# Patient Record
Sex: Female | Born: 1995 | Race: White | Hispanic: No | Marital: Single | Smoking: Current every day smoker
Health system: Southern US, Community
[De-identification: ages and names within clinical notes are randomized; demographics above are authoritative.]

## PROBLEM LIST (undated history)

## (undated) DIAGNOSIS — F32A Depression, unspecified: Secondary | ICD-10-CM

## (undated) DIAGNOSIS — R630 Anorexia: Secondary | ICD-10-CM

## (undated) DIAGNOSIS — F329 Major depressive disorder, single episode, unspecified: Secondary | ICD-10-CM

## (undated) DIAGNOSIS — F419 Anxiety disorder, unspecified: Secondary | ICD-10-CM

## (undated) HISTORY — DX: Anorexia: R63.0

---
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
## 2017-03-14 ENCOUNTER — Ambulatory Visit: Payer: Managed Care, Other (non HMO) | Admitting: Certified Nurse Midwife

## 2017-03-14 ENCOUNTER — Encounter: Payer: Self-pay | Admitting: Certified Nurse Midwife

## 2017-03-14 VITALS — BP 110/67 | HR 91 | Wt 134.6 lb

## 2017-03-14 DIAGNOSIS — R102 Pelvic and perineal pain: Secondary | ICD-10-CM | POA: Diagnosis not present

## 2017-03-14 DIAGNOSIS — N941 Unspecified dyspareunia: Secondary | ICD-10-CM | POA: Diagnosis not present

## 2017-03-14 LAB — POCT URINE PREGNANCY: PREG TEST UR: NEGATIVE

## 2017-03-14 NOTE — Patient Instructions (Signed)
Pelvic Pain, Female °Pelvic pain is pain in your lower belly (abdomen), below your belly button and between your hips. The pain may start suddenly (acute), keep coming back (recurring), or last a long time (chronic). Pelvic pain that lasts longer than six months is considered chronic. There are many causes of pelvic pain. Sometimes the cause of your pelvic pain is not known. °Follow these instructions at home: °· Take over-the-counter and prescription medicines only as told by your doctor. °· Rest as told by your doctor. °· Do not have sex it if hurts. °· Keep a journal of your pelvic pain. Write down: °? When the pain started. °? Where the pain is located. °? What seems to make the pain better or worse, such as food or your menstrual cycle. °? Any symptoms you have along with the pain. °· Keep all follow-up visits as told by your doctor. This is important. °Contact a doctor if: °· Medicine does not help your pain. °· Your pain comes back. °· You have new symptoms. °· You have unusual vaginal discharge or bleeding. °· You have a fever or chills. °· You are having a hard time pooping (constipation). °· You have blood in your pee (urine) or poop (stool). °· Your pee smells bad. °· You feel weak or lightheaded. °Get help right away if: °· You have sudden pain that is very bad. °· Your pain continues to get worse. °· You have very bad pain and also have any of the following symptoms: °? A fever. °? Feeling stick to your stomach (nausea). °? Throwing up (vomiting). °? Being very sweaty. °· You pass out (lose consciousness). °This information is not intended to replace advice given to you by your health care provider. Make sure you discuss any questions you have with your health care provider. °Document Released: 07/14/2007 Document Revised: 02/19/2015 Document Reviewed: 11/15/2014 °Elsevier Interactive Patient Education © 2018 Elsevier Inc. ° °

## 2017-03-14 NOTE — Progress Notes (Signed)
GYN ENCOUNTER NOTE  Subjective:       Megan Reed is a 22 y.o. G61P0010 female is here for gynecologic evaluation of the following issues:  1. Pelvic pain and pinful intercourse since December.  She states  She had a mirena IUD in place that she accidentally pulled out because she thought it was her tampon. After her IUD came out she became pregnant and had an abortion in March 2018.  She then had a skyla IUD placed August in 2018. She has a history of chlamydia infection that was treated. She originally went to her campus clinic and was treated with "antibiotics for pelvic infection." .  She is currently sexually active with one female partner. She states that he has had STD testing and was cleared. She complains that the pain comes in waves , that it is more with penetration, she denies it being deep or with thrusting.    Gynecologic History Patient's last menstrual period was 02/28/2017. Monthly, lasting 3-4 days with spotting intermittently. She has cramps that she treats with motrin 600 mg q 6 hours.  Uses tampons /pads changes 2-3 times a day. Increase frequency  in lose stools with period Contraception: IUD Skyla Last Pap: n/a.  Last mammogram: N/a  Past Family GYN history: Pt mother hysterectomy for endometriosis  Pt sisters have painful periods, heavy bleeding  Obstetric History OB History  Gravida Para Term Preterm AB Living  2       1    SAB TAB Ectopic Multiple Live Births               # Outcome Date GA Lbr Len/2nd Weight Sex Delivery Anes PTL Lv  2 AB 2018          1 Gravida               History reviewed. No pertinent past medical history.  History reviewed. No pertinent surgical history.  Current Outpatient Medications on File Prior to Visit  Medication Sig Dispense Refill  . lisdexamfetamine (VYVANSE) 30 MG capsule Take 30 mg by mouth daily.    . sertraline (ZOLOFT) 25 MG tablet Take 25 mg by mouth daily.     No current facility-administered medications on file  prior to visit.     Not on File  Social History   Socioeconomic History  . Marital status: Single    Spouse name: Not on file  . Number of children: Not on file  . Years of education: Not on file  . Highest education level: Not on file  Social Needs  . Financial resource strain: Not on file  . Food insecurity - worry: Not on file  . Food insecurity - inability: Not on file  . Transportation needs - medical: Not on file  . Transportation needs - non-medical: Not on file  Occupational History  . Not on file  Tobacco Use  . Smoking status: Never Smoker  . Smokeless tobacco: Never Used  Substance and Sexual Activity  . Alcohol use: No    Frequency: Never  . Drug use: No  . Sexual activity: Yes    Birth control/protection: IUD  Other Topics Concern  . Not on file  Social History Narrative  . Not on file    History reviewed. No pertinent family history.  The following portions of the patient's history were reviewed and updated as appropriate: allergies, current medications, past family history, past medical history, past social history, past surgical history and problem list.  Review  of Systems Review of Systems - Negative except as mentioned in HPI Review of Systems - General ROS: negative for -  fatigue, fever, hot flashes, malaise or night sweats. Positive for chills Hematological and Lymphatic ROS: negative for - bleeding problems or swollen lymph nodes Gastrointestinal ROS: negative for - abdominal pain, blood in stools, and nausea/vomiting. Positive for change in bowel habits, increased and lose with period.  Musculoskeletal ROS: negative for - joint pain, muscle pain or muscular weakness Genito-Urinary ROS: negative for - change in menstrual cycle, dysmenorrhea,  dysuria, genital discharge, genital ulcers, hematuria, incontinence, irregular/heavy menses, nocturia. Positive for  pelvic pain and dyspareunia  Objective:   BP 110/67   Pulse 91   Wt 134 lb 9.6 oz (61.1  kg)   LMP 02/28/2017 Comment: irregular CONSTITUTIONAL: Well-developed, well-nourished female in no acute distress.  HENT:  Normocephalic, atraumatic.  NECK: Normal range of motion,  SKIN: Skin is warm and dry. No rash noted. Not diaphoretic. No erythema. No pallor. NEUROLGIC: Alert and oriented to person, place, and time.  PSYCHIATRIC: Normal mood and affect. Normal behavior. Normal judgment and thought content. CARDIOVASCULAR:Not Examined RESPIRATORY: Not Examined BREASTS: Not Examined ABDOMEN: Soft, non distended; Non tender. Tenderness over left ovary.  No Organomegaly. PELVIC: Per Dr. Greggory Keenefrancesco exam   External Genitalia: Normal, small scar on right side from abscess. Right labia noted to have dark brown discoloration that was biopsied and resulted normal. No lesions   BUS: Normal  Vagina: Normal, vaginismus with placement of speculum and bimanual exam, no lesions or odor   Cervix: Normal in appearance, strings present, 2/4 CVA tenderness   Uterus: Normal size, shape,consistency, mobile 2/4 tenderness , retroverted   Adnexa: Normal  RV: Normal   Bladder: Nontender MUSCULOSKELETAL: Normal range of motion. No tenderness.  No cyanosis, clubbing, or edema.   Reviewed Results from Clinic ([facility]) 02/28/17 from the clinic were negative for chlamydia, gonorrhea , and trichomoniasis. To be scanned into chart.   Assessment:   1. Missed menses - POCT urine pregnancy  2. Pelvic pain - Hepatitis B surface antigen - HIV antibody - HSV 1 AND 2 IGM ABS, INDIRECT - NuSwab Vaginitis Plus (VG+) - RPR - US PELVIS (TRANSABDOMINAL ONLY); Future   3. Vaginismus  Plan:   R/o STD's. Labs HIV, RPR, Hep B, HSV. Nuswab ,  and ultrasound for IUD placement. Discussed possibility of endometriosis , reviewed treatment options , and need for laparoscopy for diagnosis. Pamphlet given . Reviewed use of Mirena IUD, birth control pills, and motrin for pain for treatment of endometriosis .  Pt states that  she had the mirena the first time but switched due to acne. Discussed possible causes of vaginismus and psychological causes. She verbalizes understanding and agrees with plan. Will follow up with results of labs and ultrasound. Reviewed and summarization of old records and current  HPI with Dr. Greggory Keenefrancesco to develop plan of care.   Doreene BurkeAnnie Demica Zook, CNM

## 2017-03-14 NOTE — Progress Notes (Signed)
PT stated that she had an abortion and also [facility] Test results were faxed to office all were neg. PT wants to know if all of her results.

## 2017-03-15 ENCOUNTER — Telehealth: Payer: Self-pay | Admitting: Certified Nurse Midwife

## 2017-03-15 LAB — HIV ANTIBODY (ROUTINE TESTING W REFLEX): HIV Screen 4th Generation wRfx: NONREACTIVE

## 2017-03-15 LAB — HSV 1 AND 2 IGM ABS, INDIRECT: HSV 1 IgM: <1:10 {titer}

## 2017-03-15 LAB — HEPATITIS B SURFACE ANTIGEN: HEP B S AG: NEGATIVE

## 2017-03-15 LAB — RPR: RPR: NONREACTIVE

## 2017-03-15 NOTE — Telephone Encounter (Signed)
Call placed to Megan Reed regarding results. NO answer. Left message for her to call back.   [name], CNM

## 2017-03-16 LAB — NUSWAB VAGINITIS PLUS (VG+)
CANDIDA ALBICANS, NAA: NEGATIVE
CANDIDA GLABRATA, NAA: NEGATIVE
CHLAMYDIA TRACHOMATIS, NAA: NEGATIVE
NEISSERIA GONORRHOEAE, NAA: NEGATIVE
TRICH VAG BY NAA: NEGATIVE

## 2017-03-18 ENCOUNTER — Telehealth: Payer: Self-pay

## 2017-03-18 ENCOUNTER — Telehealth: Payer: Self-pay | Admitting: Certified Nurse Midwife

## 2017-03-18 NOTE — Telephone Encounter (Signed)
Patient returned your call regarding results. Thanks

## 2017-03-18 NOTE — Telephone Encounter (Signed)
Voicemail message left for pt

## 2017-03-18 NOTE — Telephone Encounter (Signed)
Voicemail message left- normal results per provider

## 2018-02-17 ENCOUNTER — Other Ambulatory Visit: Payer: Self-pay

## 2018-02-17 ENCOUNTER — Emergency Department
Admission: EM | Admit: 2018-02-17 | Discharge: 2018-02-17 | Disposition: A | Discharge status: Home / Self Care | Payer: PRIVATE HEALTH INSURANCE | Attending: Emergency Medicine | Admitting: Emergency Medicine

## 2018-02-17 ENCOUNTER — Emergency Department: Payer: PRIVATE HEALTH INSURANCE

## 2018-02-17 ENCOUNTER — Encounter: Payer: Self-pay | Admitting: Emergency Medicine

## 2018-02-17 DIAGNOSIS — Z202 Contact with and (suspected) exposure to infections with a predominantly sexual mode of transmission: Secondary | ICD-10-CM | POA: Diagnosis not present

## 2018-02-17 DIAGNOSIS — N898 Other specified noninflammatory disorders of vagina: Secondary | ICD-10-CM | POA: Diagnosis present

## 2018-02-17 DIAGNOSIS — R102 Pelvic and perineal pain: Secondary | ICD-10-CM

## 2018-02-17 LAB — URINALYSIS, COMPLETE (UACMP) WITH MICROSCOPIC
BACTERIA UA: NONE SEEN
Bilirubin Urine: NEGATIVE
Glucose, UA: NEGATIVE mg/dL
Ketones, ur: NEGATIVE mg/dL
Leukocytes, UA: NEGATIVE
Nitrite: NEGATIVE
Protein, ur: NEGATIVE mg/dL
Specific Gravity, Urine: 1.016 (ref 1.005–1.030)
pH: 7 (ref 5.0–8.0)

## 2018-02-17 LAB — CBC
HCT: 39.6 % (ref 36.0–46.0)
Hemoglobin: 12.9 g/dL (ref 12.0–15.0)
MCH: 32.5 pg (ref 26.0–34.0)
MCHC: 32.6 g/dL (ref 30.0–36.0)
MCV: 99.7 fL (ref 80.0–100.0)
Platelets: 207 10*3/uL (ref 150–400)
RBC: 3.97 MIL/uL (ref 3.87–5.11)
RDW: 13.4 % (ref 11.5–15.5)
WBC: 5.1 10*3/uL (ref 4.0–10.5)
nRBC: 0 % (ref 0.0–0.2)

## 2018-02-17 LAB — BASIC METABOLIC PANEL
Anion gap: 7 (ref 5–15)
BUN: 10 mg/dL (ref 6–20)
CO2: 27 mmol/L (ref 22–32)
Calcium: 9.3 mg/dL (ref 8.9–10.3)
Chloride: 103 mmol/L (ref 98–111)
Creatinine, Ser: 0.63 mg/dL (ref 0.44–1.00)
GFR calc Af Amer: >60 mL/min (ref >60)
GFR calc non Af Amer: >60 mL/min (ref >60)
Glucose, Bld: 97 mg/dL (ref 70–99)
Potassium: 3.3 mmol/L — ABNORMAL LOW (ref 3.5–5.1)
Sodium: 137 mmol/L (ref 135–145)

## 2018-02-17 LAB — WET PREP, GENITAL
Clue Cells Wet Prep HPF POC: NONE SEEN
Sperm: NONE SEEN
Trich, Wet Prep: NONE SEEN
Yeast Wet Prep HPF POC: NONE SEEN

## 2018-02-17 LAB — CHLAMYDIA/NGC RT PCR (ARMC ONLY)
Chlamydia Tr: NOT DETECTED
N gonorrhoeae: NOT DETECTED

## 2018-02-17 LAB — POCT PREGNANCY, URINE: Preg Test, Ur: NEGATIVE

## 2018-02-17 MED ORDER — AZITHROMYCIN 500 MG PO TABS
1000.0000 mg | ORAL_TABLET | Freq: Once | ORAL | Status: AC
  Administered 2018-02-17: 1000 mg via ORAL
  Filled 2018-02-17: qty 2

## 2018-02-17 MED ORDER — LIDOCAINE HCL (PF) 1 % IJ SOLN: 5.0000 mL | Freq: Once | INTRAMUSCULAR | Status: DC

## 2018-02-17 MED ORDER — IBUPROFEN 600 MG PO TABS
600.0000 mg | ORAL_TABLET | Freq: Once | ORAL | Status: AC
  Administered 2018-02-17: 600 mg via ORAL
  Filled 2018-02-17: qty 1

## 2018-02-17 MED ORDER — OXYCODONE-ACETAMINOPHEN 5-325 MG PO TABS: 1.0000 | ORAL_TABLET | Freq: Once | ORAL | Status: DC

## 2018-02-17 MED ORDER — DEXTROSE 5 % IV SOLN
250.0000 mg | Freq: Once | INTRAVENOUS | Status: AC
  Administered 2018-02-17: 250 mg via INTRAVENOUS
  Filled 2018-02-17: qty 250

## 2018-02-17 NOTE — Discharge Instructions (Addendum)
You were treated today empirically for gonorrhea and chlamydia.  Your results are pending and you may call the hospital later for results.  Your ultrasound was negative and I do not visualize any foreign body.  Please call gynecology on Monday for an appointment next week for recheck and return to the emergency department over the weekend for any worsening or change of symptoms.

## 2018-02-17 NOTE — ED Provider Notes (Signed)
[facility] Emergency Department Provider Note  ____________________________________________  Time seen: Approximately 2:47 PM  I have reviewed the triage vital signs and the nursing notes.   HISTORY  Chief Complaint Foreign Body in Vagina    HPI Megan Reed is a 23 y.o. female presents to emergency department for evaluation of vaginal discharge and questionable retained tampon.  Patient states that she was drinking 6 days ago and had intercourse with her boyfriend that evening.  The next morning, she could not remember if she had removed her tampon or not.  She was unable to locate the tampon and continued to have intercourse this week.  This morning she noticed a foul discharge.  She has had pain with intercourse and some lower abdominal cramping, worse on the right.  She states that she just got back together with her boyfriend and it is possible she has STD.  She has had chlamydia before.  Patient has an IUD.  Last menstrual period was January 1.  No fever, chills.   History reviewed. No pertinent past medical history.  There are no active problems to display for this patient.   History reviewed. No pertinent surgical history.  Prior to Admission medications   Medication Sig Start Date End Date Taking? Authorizing Provider  lisdexamfetamine (VYVANSE) 30 MG capsule Take 30 mg by mouth daily.   Yes [provider]  lurasidone (LATUDA) 40 MG TABS tablet Take 40 mg by mouth daily with breakfast.   Yes [provider]  sertraline (ZOLOFT) 25 MG tablet Take 25 mg by mouth daily.    [provider]    Allergies Patient has no known allergies.  No family history on file.  Social History Social History   Tobacco Use  . Smoking status: Never Smoker  . Smokeless tobacco: Never Used  Substance Use Topics  . Alcohol use: No    Frequency: Never  . Drug use: No     Review of Systems  Constitutional: No  fever/chills Cardiovascular: No chest pain. Respiratory: No cough. No SOB. Gastrointestinal: Positive right lower quadrant pain.  No nausea, no vomiting.  Musculoskeletal: Negative for musculoskeletal pain. Skin: Negative for rash, abrasions, lacerations, ecchymosis.   ____________________________________________   PHYSICAL EXAM:  VITAL SIGNS: ED Triage Vitals  Enc Vitals Group     BP 02/17/18 1413 100/64     Pulse Rate 02/17/18 1413 93     Resp 02/17/18 1413 16     Temp 02/17/18 1413 97.6 F (36.4 C)     Temp src --      SpO2 02/17/18 1413 100 %     Weight 02/17/18 1352 134 lb 11.2 oz (61.1 kg)     Height --      Head Circumference --      Peak Flow --      Pain Score 02/17/18 1352 3     Pain Loc --      Pain Edu? --      Excl. in GC? --      Constitutional: Alert and oriented. Well appearing and in no acute distress. Eyes: Conjunctivae are normal. PERRL. EOMI. Head: Atraumatic. ENT:      Ears:      Nose: No congestion/rhinnorhea.      Mouth/Throat: Mucous membranes are moist.  Neck: No stridor. Cardiovascular: Normal rate, regular rhythm.  Good peripheral circulation. Respiratory: Normal respiratory effort without tachypnea or retractions. Lungs CTAB. Good air entry to the bases with no decreased or absent breath  sounds. Gastrointestinal: Bowel sounds 4 quadrants. Soft and nontender to palpation. No guarding or rigidity. No palpable masses. No distention. Genitourinary: No external rashes or lesions.  Dark discharge in vaginal canal. No foreign body.  No cervical motion tenderness. Musculoskeletal: Full range of motion to all extremities. No gross deformities appreciated. Neurologic:  Normal speech and language. No gross focal neurologic deficits are appreciated.  Skin:  Skin is warm, dry and intact. No rash noted. Psychiatric: Mood and affect are normal. Speech and behavior are normal. Patient exhibits appropriate insight and  judgement.   ____________________________________________   LABS (all labs ordered are listed, but only abnormal results are displayed)  Labs Reviewed  WET PREP, GENITAL - Abnormal; Notable for the following components:      Result Value   WBC, Wet Prep HPF POC FEW (*)    All other components within normal limits  URINALYSIS, COMPLETE (UACMP) WITH MICROSCOPIC - Abnormal; Notable for the following components:   Color, Urine YELLOW (*)    APPearance CLEAR (*)    Hgb urine dipstick LARGE (*)    All other components within normal limits  CHLAMYDIA/NGC RT PCR ([facility] ONLY)  CBC  BASIC METABOLIC PANEL  POC URINE PREG, ED  POCT PREGNANCY, URINE   ____________________________________________  EKG   ____________________________________________  RADIOLOGY   US Pelvic Complete With Transvaginal  Result Date: 02/17/2018 CLINICAL DATA:  Initial evaluation for acute pelvic pain, vaginal discharge. EXAM: TRANSABDOMINAL AND TRANSVAGINAL ULTRASOUND OF PELVIS TECHNIQUE: Both transabdominal and transvaginal ultrasound examinations of the pelvis were performed. Transabdominal technique was performed for global imaging of the pelvis including uterus, ovaries, adnexal regions, and pelvic cul-de-sac. It was necessary to proceed with endovaginal exam following the transabdominal exam to visualize the uterus, endometrium, and ovaries. COMPARISON:  None available. FINDINGS: Uterus Measurements: 7.1 x 3.7 x 5.2 cm = volume: 71.3 mL. No fibroids or other mass visualized. Endometrium Thickness: 3.3 mm. No focal abnormality visualized. IUD in appropriate position within the endometrial cavity. Right ovary Measurements: 3.3 x 1.3 x 2.8 cm = volume: 6.1 mL. Normal appearance/no adnexal mass. Left ovary Measurements: 2.7 x 2.2 x 2.0 cm = volume: 5.9 mL. Normal appearance/no adnexal mass. Other findings Trace free physiologic fluid noted within the pelvis. IMPRESSION: 1. Normal pelvic ultrasound.  No acute  abnormality identified. 2. IUD in appropriate position within the endometrial cavity. Electronically Signed   By: Rise Mu M.D.   On: 02/17/2018 15:59    ____________________________________________    PROCEDURES  Procedure(s) performed:    Procedures    Medications  cefTRIAXone (ROCEPHIN) 250 mg in dextrose 5 % 50 mL IVPB (has no administration in time range)  azithromycin (ZITHROMAX) tablet 1,000 mg (has no administration in time range)  ibuprofen (ADVIL,MOTRIN) tablet 600 mg (has no administration in time range)     ____________________________________________   INITIAL IMPRESSION / ASSESSMENT AND PLAN / ED COURSE  Pertinent labs & imaging results that were available during my care of the patient were reviewed by me and considered in my medical decision making (see chart for details).  Review of the [place] CSRS was performed in accordance of the [facility] prior to dispensing any controlled drugs.   Patient presented the emergency department for evaluation of vaginal discharge and pelvic pain.  Vital signs and exam are reassuring.  No foreign body seen on pelvic exam.  Patient has a normal pelvic ultrasound.  Patient will be treated empirically for STDs.  CBC and CMP are pending and [facility] work with plan of discharge.  Patient was referred to gynecology.  She will return to the emergency department for any change or worsening of symptoms.     ____________________________________________  FINAL CLINICAL IMPRESSION(S) / ED DIAGNOSES  Final diagnoses:  Exposure to STD      NEW MEDICATIONS STARTED DURING THIS VISIT:  ED Discharge Orders    None          This chart was dictated using voice recognition software/Dragon. Despite best efforts to proofread, errors can occur which can change the meaning. Any change was purely unintentional.    Enid DerryWagner, Annalaya Wile, PA-C 02/17/18 1621    Don PerkingVeronese, [place], MD 02/19/18  [phone]

## 2018-02-17 NOTE — ED Notes (Signed)
See triage note.  Says she thinks she has a tampon in vagina for 5 days.  Now with some discharge and pain at times.

## 2018-02-17 NOTE — ED Triage Notes (Signed)
C/O retained tampon x 10 days.

## 2018-02-17 NOTE — ED Notes (Signed)
Pelvic exam by Morrie Sheldonashley pa

## 2018-02-17 NOTE — ED Provider Notes (Signed)
-----------------------------------------   4:59 PM on 02/17/2018 -----------------------------------------   Blood pressure 100/64, pulse 93, temperature 97.6 F (36.4 C), resp. rate 16, weight 61.1 kg, last menstrual period 02/08/2018, SpO2 100 %.  Assuming care from Enid Derry, PA-C.  In short, Megan Reed is a 23 y.o. female with a chief complaint of Foreign Body in Vagina .  Refer to the original H&P for additional details.  Patient care was transferred to myself pending CBC and CMP. At this time, these labs have returned with reassuring results.  Patient has been treated empirically for gonorrhea and chlamydia based off of symptoms and exam.   No evidence of pelvic inflammatory disease based off of previous providers report a physical exam.  CBC and CMP returned with reassuring results.  Patient is already been treated with Rocephin and azithromycin.  She has been given a referral for OB/GYN.  At this time, no further work-up deemed necessary.  Patient has been discharged.   Impression: Exposure to STD.        Racheal Patches, PA-C 02/17/18 1704    Minna Antis, MD 02/17/18 2306

## 2018-02-20 ENCOUNTER — Emergency Department: Payer: No Typology Code available for payment source

## 2018-02-20 ENCOUNTER — Other Ambulatory Visit: Payer: Self-pay

## 2018-02-20 ENCOUNTER — Emergency Department
Admission: EM | Admit: 2018-02-20 | Discharge: 2018-02-20 | Disposition: A | Discharge status: Home / Self Care | Payer: No Typology Code available for payment source | Attending: Emergency Medicine | Admitting: Emergency Medicine

## 2018-02-20 ENCOUNTER — Encounter: Payer: Self-pay | Admitting: Radiology

## 2018-02-20 DIAGNOSIS — R1033 Periumbilical pain: Secondary | ICD-10-CM | POA: Diagnosis present

## 2018-02-20 DIAGNOSIS — Z79899 Other long term (current) drug therapy: Secondary | ICD-10-CM | POA: Insufficient documentation

## 2018-02-20 DIAGNOSIS — R1030 Lower abdominal pain, unspecified: Secondary | ICD-10-CM | POA: Diagnosis not present

## 2018-02-20 LAB — COMPREHENSIVE METABOLIC PANEL
ALT: 15 U/L (ref 0–44)
AST: 20 U/L (ref 15–41)
Albumin: 4.2 g/dL (ref 3.5–5.0)
Alkaline Phosphatase: 54 U/L (ref 38–126)
Anion gap: 5 (ref 5–15)
BILIRUBIN TOTAL: 0.4 mg/dL (ref 0.3–1.2)
BUN: 10 mg/dL (ref 6–20)
CO2: 28 mmol/L (ref 22–32)
Calcium: 9.2 mg/dL (ref 8.9–10.3)
Chloride: 104 mmol/L (ref 98–111)
Creatinine, Ser: 0.79 mg/dL (ref 0.44–1.00)
GFR calc Af Amer: >60 mL/min (ref >60)
GFR calc non Af Amer: >60 mL/min (ref >60)
Glucose, Bld: 93 mg/dL (ref 70–99)
Potassium: 4.2 mmol/L (ref 3.5–5.1)
Sodium: 137 mmol/L (ref 135–145)
TOTAL PROTEIN: 7.1 g/dL (ref 6.5–8.1)

## 2018-02-20 LAB — CBC
HCT: 38.8 % (ref 36.0–46.0)
Hemoglobin: 12.7 g/dL (ref 12.0–15.0)
MCH: 32 pg (ref 26.0–34.0)
MCHC: 32.7 g/dL (ref 30.0–36.0)
MCV: 97.7 fL (ref 80.0–100.0)
Platelets: 233 10*3/uL (ref 150–400)
RBC: 3.97 MIL/uL (ref 3.87–5.11)
RDW: 13.1 % (ref 11.5–15.5)
WBC: 4 10*3/uL (ref 4.0–10.5)
nRBC: 0 % (ref 0.0–0.2)

## 2018-02-20 LAB — LIPASE, BLOOD: Lipase: 27 U/L (ref 11–51)

## 2018-02-20 MED ORDER — HYDROMORPHONE HCL 1 MG/ML IJ SOLN
0.5000 mg | Freq: Once | INTRAMUSCULAR | Status: AC
  Administered 2018-02-20: 0.5 mg via INTRAVENOUS

## 2018-02-20 MED ORDER — HYDROMORPHONE HCL 1 MG/ML IJ SOLN
INTRAMUSCULAR | Status: AC
  Filled 2018-02-20: qty 1

## 2018-02-20 MED ORDER — SODIUM CHLORIDE 0.9 % IV SOLN
1000.0000 mL | Freq: Once | INTRAVENOUS | Status: AC
  Administered 2018-02-20: 1000 mL via INTRAVENOUS

## 2018-02-20 MED ORDER — IOHEXOL 300 MG/ML  SOLN
100.0000 mL | Freq: Once | INTRAMUSCULAR | Status: AC | PRN
  Administered 2018-02-20: 100 mL via INTRAVENOUS
  Filled 2018-02-20: qty 100

## 2018-02-20 MED ORDER — ONDANSETRON HCL 4 MG/2ML IJ SOLN
4.0000 mg | Freq: Once | INTRAMUSCULAR | Status: AC
  Administered 2018-02-20: 4 mg via INTRAVENOUS
  Filled 2018-02-20: qty 2

## 2018-02-20 MED ORDER — IOPAMIDOL (ISOVUE-300) INJECTION 61%
30.0000 mL | Freq: Once | INTRAVENOUS | Status: AC | PRN
  Administered 2018-02-20: 30 mL via ORAL
  Filled 2018-02-20: qty 30

## 2018-02-20 MED ORDER — MORPHINE SULFATE (PF) 4 MG/ML IV SOLN
4.0000 mg | Freq: Once | INTRAVENOUS | Status: AC
  Administered 2018-02-20: 4 mg via INTRAVENOUS
  Filled 2018-02-20: qty 1

## 2018-02-20 NOTE — ED Triage Notes (Signed)
Sharp upper abdominal pain x 1 hour.

## 2018-02-20 NOTE — ED Notes (Signed)
Pt taken to CT.

## 2018-02-20 NOTE — ED Notes (Signed)
Dr Kinner at bedside to update pt 

## 2018-02-20 NOTE — ED Notes (Signed)
CT notified that pt is done drinking PO contrast.

## 2018-02-20 NOTE — ED Provider Notes (Signed)
[facility] Emergency Department Provider Note   ____________________________________________    I have reviewed the triage vital signs and the nursing notes.   HISTORY  Chief Complaint Abdominal Pain     HPI Megan Reed is a 23 y.o. female who presents with complaints of abdominal pain.  Patient describes relatively abrupt onset of abdominal pain around noon today.  She describes it as primarily periumbilical.  She is never had this before.  She did not take anything for this.  She describes the pain as moderate to severe and sharp in nature.  No radiation of pain.  No vaginal bleeding or vaginal discharge or dysuria.  No nausea or vomiting.   History reviewed. No pertinent past medical history.  There are no active problems to display for this patient.   No past surgical history on file.  Prior to Admission medications   Medication Sig Start Date End Date Taking? Authorizing Provider  lisdexamfetamine (VYVANSE) 30 MG capsule Take 30 mg by mouth daily.    [provider]  lurasidone (LATUDA) 40 MG TABS tablet Take 40 mg by mouth daily with breakfast.    [provider]  sertraline (ZOLOFT) 25 MG tablet Take 25 mg by mouth daily.    [provider]     Allergies Patient has no known allergies.  No family history on file.  Social History Social History   Tobacco Use  . Smoking status: Never Smoker  . Smokeless tobacco: Never Used  Substance Use Topics  . Alcohol use: No    Frequency: Never  . Drug use: No    Review of Systems  Constitutional: No fever/chills Eyes: No visual changes.  ENT: No sore throat. Cardiovascular: Denies chest pain. Respiratory: Denies shortness of breath. Gastrointestinal: As above Genitourinary: Negative for dysuria.  As above Musculoskeletal: Negative for back pain. Skin: Negative for rash. Neurological: Negative for headaches     ____________________________________________   PHYSICAL EXAM:  VITAL SIGNS: ED Triage Vitals  Enc Vitals Group     BP 02/20/18 1421 127/62     Pulse Rate 02/20/18 1421 (!) 56     Resp 02/20/18 1421 18     Temp 02/20/18 1421 98 F (36.7 C)     Temp Source 02/20/18 1421 Oral     SpO2 02/20/18 1421 99 %     Weight 02/20/18 1422 60.8 kg (134 lb)     Height 02/20/18 1422 1.715 m (5' 7.5")     Head Circumference --      Peak Flow --      Pain Score 02/20/18 1422 9     Pain Loc --      Pain Edu? --      Excl. in GC? --     Constitutional: Alert and oriented.  Uncomfortable tearful Eyes: Conjunctivae are normal.   Nose: No congestion/rhinnorhea. Mouth/Throat: Mucous membranes are moist.   Neck:  Painless ROM Cardiovascular: Normal rate, regular rhythm. Grossly normal heart sounds.  Good peripheral circulation. Respiratory: Normal respiratory effort.  No retractions. Lungs CTAB. Gastrointestinal: Mild tenderness palpation periumbilically, no distention, no CVA tenderness, nonsurgical abdomen  Musculoskeletal:.  Warm and well perfused Neurologic:  Normal speech and language. No gross focal neurologic deficits are appreciated.  Skin:  Skin is warm, dry and intact. No rash noted. Psychiatric: Mood and affect are normal. Speech and behavior are normal.  ____________________________________________   LABS (all labs ordered are listed, but only abnormal results are displayed)  Labs Reviewed  LIPASE, BLOOD  COMPREHENSIVE METABOLIC PANEL  CBC   ____________________________________________  EKG  ED ECG REPORT I, Jene Every, the attending physician, personally viewed and interpreted this ECG.  Date: 02/20/2018  Rhythm: normal sinus rhythm QRS Axis: normal Intervals: normal ST/T Wave abnormalities: normal Narrative Interpretation: no evidence of acute ischemia  ____________________________________________  RADIOLOGY  CT abdomen pelvis unremarkable, small  amount of pelvic fluid ____________________________________________   PROCEDURES  Procedure(s) performed: No  Procedures   Critical Care performed: No ____________________________________________   INITIAL IMPRESSION / ASSESSMENT AND PLAN / ED COURSE  Pertinent labs & imaging results that were available during my care of the patient were reviewed by me and considered in my medical decision making (see chart for details).  Patient presents with moderate to severe abdominal pain around her umbilicus, differential includes appendicitis, ovarian cyst, gastritis, enteritis, colitis  Lab work is overall quite reassuring.  Patient treated with morphine little improvement, half milligram of Dilaudid given with significant improvement.  Given level of pain CT obtained which was unremarkable.  Small amount of free pelvic fluid, possible ovarian cyst rupture?  However pain primarily around the umbilicus, no one-sided pain to suggest ovarian symptomatology.  She is feeling much better, observed for quite some time in the emergency department without recurrence of pain, appropriate for discharge at this time strict return precautions discussed, outpatient follow-up    ____________________________________________   FINAL CLINICAL IMPRESSION(S) / ED DIAGNOSES  Final diagnoses:  Lower abdominal pain        Note:  This document was prepared using Dragon voice recognition software and may include unintentional dictation errors.   Jene Every, MD 02/20/18 2138

## 2018-11-01 ENCOUNTER — Other Ambulatory Visit: Payer: Self-pay

## 2018-11-01 ENCOUNTER — Emergency Department
Admission: EM | Admit: 2018-11-01 | Discharge: 2018-11-01 | Disposition: A | Discharge status: Home / Self Care | Payer: No Typology Code available for payment source | Attending: Emergency Medicine | Admitting: Emergency Medicine

## 2018-11-01 ENCOUNTER — Encounter: Payer: Self-pay | Admitting: Emergency Medicine

## 2018-11-01 ENCOUNTER — Emergency Department: Payer: No Typology Code available for payment source

## 2018-11-01 DIAGNOSIS — Z5321 Procedure and treatment not carried out due to patient leaving prior to being seen by health care provider: Secondary | ICD-10-CM | POA: Insufficient documentation

## 2018-11-01 DIAGNOSIS — M25572 Pain in left ankle and joints of left foot: Secondary | ICD-10-CM | POA: Diagnosis present

## 2018-11-01 NOTE — ED Notes (Signed)
Pt given an ice pack  °

## 2018-11-01 NOTE — ED Triage Notes (Signed)
Pt to triage via w/c, mask in place with no distress noted; pt reports tripped in ditch injuring left ankle; swelling/bruising noted to lateral ankle

## 2018-11-01 NOTE — ED Notes (Signed)
Patient states she wants to leave. Urged patient to stay, but patient states she has exam in the morning. Patient states she will come back after exam.

## 2018-11-02 ENCOUNTER — Telehealth: Payer: Self-pay | Admitting: Emergency Medicine

## 2018-11-02 NOTE — Telephone Encounter (Signed)
Called patient due to lwot to inquire about condition and follow up plans. She has appt tomorrow.

## 2018-11-09 ENCOUNTER — Other Ambulatory Visit: Payer: Self-pay | Admitting: Sports Medicine

## 2018-11-09 DIAGNOSIS — M25472 Effusion, left ankle: Secondary | ICD-10-CM

## 2018-11-09 DIAGNOSIS — S99912A Unspecified injury of left ankle, initial encounter: Secondary | ICD-10-CM

## 2018-11-22 ENCOUNTER — Ambulatory Visit
Admission: RE | Admit: 2018-11-22 | Discharge: 2018-11-22 | Disposition: A | Discharge status: Home / Self Care | Payer: No Typology Code available for payment source | Source: Ambulatory Visit | Attending: Sports Medicine | Admitting: Sports Medicine

## 2018-11-22 ENCOUNTER — Other Ambulatory Visit: Payer: Self-pay

## 2018-11-22 DIAGNOSIS — M25572 Pain in left ankle and joints of left foot: Secondary | ICD-10-CM | POA: Insufficient documentation

## 2018-11-22 DIAGNOSIS — M25472 Effusion, left ankle: Secondary | ICD-10-CM | POA: Diagnosis present

## 2018-11-22 DIAGNOSIS — S99912A Unspecified injury of left ankle, initial encounter: Secondary | ICD-10-CM | POA: Insufficient documentation

## 2019-06-02 ENCOUNTER — Encounter: Payer: Self-pay | Admitting: Emergency Medicine

## 2019-06-02 ENCOUNTER — Other Ambulatory Visit: Payer: Self-pay

## 2019-06-02 ENCOUNTER — Emergency Department
Admission: EM | Admit: 2019-06-02 | Discharge: 2019-06-04 | Disposition: A | Discharge status: Other Acute Inpatient Hospital | Payer: PRIVATE HEALTH INSURANCE | Attending: Emergency Medicine | Admitting: Emergency Medicine

## 2019-06-02 DIAGNOSIS — F32A Depression, unspecified: Secondary | ICD-10-CM

## 2019-06-02 DIAGNOSIS — F419 Anxiety disorder, unspecified: Secondary | ICD-10-CM | POA: Diagnosis not present

## 2019-06-02 DIAGNOSIS — T424X2A Poisoning by benzodiazepines, intentional self-harm, initial encounter: Secondary | ICD-10-CM | POA: Diagnosis not present

## 2019-06-02 DIAGNOSIS — F10929 Alcohol use, unspecified with intoxication, unspecified: Secondary | ICD-10-CM | POA: Diagnosis not present

## 2019-06-02 DIAGNOSIS — R45851 Suicidal ideations: Secondary | ICD-10-CM | POA: Insufficient documentation

## 2019-06-02 DIAGNOSIS — N390 Urinary tract infection, site not specified: Secondary | ICD-10-CM | POA: Diagnosis not present

## 2019-06-02 DIAGNOSIS — F329 Major depressive disorder, single episode, unspecified: Secondary | ICD-10-CM | POA: Diagnosis not present

## 2019-06-02 DIAGNOSIS — R1031 Right lower quadrant pain: Secondary | ICD-10-CM | POA: Insufficient documentation

## 2019-06-02 DIAGNOSIS — F17200 Nicotine dependence, unspecified, uncomplicated: Secondary | ICD-10-CM | POA: Insufficient documentation

## 2019-06-02 DIAGNOSIS — T50902A Poisoning by unspecified drugs, medicaments and biological substances, intentional self-harm, initial encounter: Secondary | ICD-10-CM

## 2019-06-02 DIAGNOSIS — F509 Eating disorder, unspecified: Secondary | ICD-10-CM | POA: Diagnosis not present

## 2019-06-02 DIAGNOSIS — Y906 Blood alcohol level of 120-199 mg/100 ml: Secondary | ICD-10-CM | POA: Insufficient documentation

## 2019-06-02 DIAGNOSIS — Z20822 Contact with and (suspected) exposure to covid-19: Secondary | ICD-10-CM | POA: Diagnosis not present

## 2019-06-02 DIAGNOSIS — R4182 Altered mental status, unspecified: Secondary | ICD-10-CM | POA: Diagnosis present

## 2019-06-02 DIAGNOSIS — F5001 Anorexia nervosa, restricting type: Secondary | ICD-10-CM | POA: Diagnosis not present

## 2019-06-02 HISTORY — DX: Depression, unspecified: F32.A

## 2019-06-02 HISTORY — DX: Anxiety disorder, unspecified: F41.9

## 2019-06-02 NOTE — ED Triage Notes (Signed)
Patient brought in by ems from home. Patient took 9 1 mg tablets of lorazepam starting about 19:30 tonight. Per ems they were called of for unresponsive. Per ems patient was alert and oriented. Patient vital signs by ems hr 104, bp 124/72, oxygen saturation 96% and fsbs 117.

## 2019-06-03 DIAGNOSIS — F5001 Anorexia nervosa, restricting type: Secondary | ICD-10-CM

## 2019-06-03 DIAGNOSIS — F329 Major depressive disorder, single episode, unspecified: Secondary | ICD-10-CM

## 2019-06-03 DIAGNOSIS — F509 Eating disorder, unspecified: Secondary | ICD-10-CM

## 2019-06-03 DIAGNOSIS — F419 Anxiety disorder, unspecified: Secondary | ICD-10-CM

## 2019-06-03 DIAGNOSIS — F32A Depression, unspecified: Secondary | ICD-10-CM

## 2019-06-03 DIAGNOSIS — T50902A Poisoning by unspecified drugs, medicaments and biological substances, intentional self-harm, initial encounter: Secondary | ICD-10-CM

## 2019-06-03 LAB — CBC WITH DIFFERENTIAL/PLATELET
Abs Immature Granulocytes: 0.01 10*3/uL (ref 0.00–0.07)
Basophils Absolute: 0 10*3/uL (ref 0.0–0.1)
Basophils Relative: 0 %
Eosinophils Absolute: 0.1 10*3/uL (ref 0.0–0.5)
Eosinophils Relative: 3 %
HCT: 41.2 % (ref 36.0–46.0)
Hemoglobin: 14.4 g/dL (ref 12.0–15.0)
Immature Granulocytes: 0 %
Lymphocytes Relative: 31 %
Lymphs Abs: 1.3 10*3/uL (ref 0.7–4.0)
MCH: 33.2 pg (ref 26.0–34.0)
MCHC: 35 g/dL (ref 30.0–36.0)
MCV: 94.9 fL (ref 80.0–100.0)
Monocytes Absolute: 0.4 10*3/uL (ref 0.1–1.0)
Monocytes Relative: 9 %
Neutro Abs: 2.4 10*3/uL (ref 1.7–7.7)
Neutrophils Relative %: 57 %
Platelets: 233 10*3/uL (ref 150–400)
RBC: 4.34 MIL/uL (ref 3.87–5.11)
RDW: 13.6 % (ref 11.5–15.5)
WBC: 4.2 10*3/uL (ref 4.0–10.5)
nRBC: 0 % (ref 0.0–0.2)

## 2019-06-03 LAB — URINALYSIS, ROUTINE W REFLEX MICROSCOPIC
Bilirubin Urine: NEGATIVE
Glucose, UA: NEGATIVE mg/dL
Ketones, ur: NEGATIVE mg/dL
Nitrite: POSITIVE — AB
Specific Gravity, Urine: 1.02 (ref 1.005–1.030)
pH: 7 (ref 5.0–8.0)

## 2019-06-03 LAB — COMPREHENSIVE METABOLIC PANEL
ALT: 20 U/L (ref 0–44)
AST: 24 U/L (ref 15–41)
Albumin: 4.5 g/dL (ref 3.5–5.0)
Alkaline Phosphatase: 66 U/L (ref 38–126)
Anion gap: 10 (ref 5–15)
BUN: 11 mg/dL (ref 6–20)
CO2: 27 mmol/L (ref 22–32)
Calcium: 9 mg/dL (ref 8.9–10.3)
Chloride: 104 mmol/L (ref 98–111)
Creatinine, Ser: 0.6 mg/dL (ref 0.44–1.00)
GFR calc Af Amer: >60 mL/min (ref >60)
GFR calc non Af Amer: >60 mL/min (ref >60)
Glucose, Bld: 94 mg/dL (ref 70–99)
Potassium: 3.9 mmol/L (ref 3.5–5.1)
Sodium: 141 mmol/L (ref 135–145)
Total Bilirubin: 0.5 mg/dL (ref 0.3–1.2)
Total Protein: 7.7 g/dL (ref 6.5–8.1)

## 2019-06-03 LAB — URINALYSIS, MICROSCOPIC (REFLEX): WBC, UA: >50 WBC/hpf (ref 0–5)

## 2019-06-03 LAB — URINE DRUG SCREEN, QUALITATIVE (ARMC ONLY)
Amphetamines, Ur Screen: NOT DETECTED
Barbiturates, Ur Screen: NOT DETECTED
Benzodiazepine, Ur Scrn: POSITIVE — AB
Cannabinoid 50 Ng, Ur ~~LOC~~: POSITIVE — AB
Cocaine Metabolite,Ur ~~LOC~~: NOT DETECTED
MDMA (Ecstasy)Ur Screen: NOT DETECTED
Methadone Scn, Ur: NOT DETECTED
Opiate, Ur Screen: NOT DETECTED
Phencyclidine (PCP) Ur S: NOT DETECTED
Tricyclic, Ur Screen: NOT DETECTED

## 2019-06-03 LAB — ACETAMINOPHEN LEVEL: Acetaminophen (Tylenol), Serum: <10 ug/mL — ABNORMAL LOW (ref 10–30)

## 2019-06-03 LAB — RESPIRATORY PANEL BY RT PCR (FLU A&B, COVID)
Influenza A by PCR: NEGATIVE
Influenza B by PCR: NEGATIVE
SARS Coronavirus 2 by RT PCR: NEGATIVE

## 2019-06-03 LAB — PREGNANCY, URINE: Preg Test, Ur: NEGATIVE

## 2019-06-03 LAB — ETHANOL: Alcohol, Ethyl (B): 178 mg/dL — ABNORMAL HIGH (ref <10)

## 2019-06-03 LAB — SALICYLATE LEVEL: Salicylate Lvl: <7 mg/dL — ABNORMAL LOW (ref 7.0–30.0)

## 2019-06-03 MED ORDER — CEPHALEXIN 500 MG PO CAPS
500.0000 mg | ORAL_CAPSULE | Freq: Two times a day (BID) | ORAL | Status: DC
  Administered 2019-06-03 – 2019-06-04 (×3): 500 mg via ORAL
  Filled 2019-06-03 (×6): qty 1

## 2019-06-03 MED ORDER — NICOTINE 7 MG/24HR TD PT24
7.0000 mg | MEDICATED_PATCH | Freq: Once | TRANSDERMAL | Status: AC
  Administered 2019-06-03: 7 mg via TRANSDERMAL
  Filled 2019-06-03: qty 1

## 2019-06-03 MED ORDER — ONDANSETRON 4 MG PO TBDP
4.0000 mg | ORAL_TABLET | Freq: Once | ORAL | Status: AC
  Administered 2019-06-03: 09:00:00 4 mg via ORAL
  Filled 2019-06-03: qty 1

## 2019-06-03 NOTE — ED Notes (Signed)
Pt. Transferred to BHU from ED to room 3 after screening for contraband. Report to include Situation, Background, Assessment and Recommendations from Kim Summers RN. Pt. Oriented to unit including Q15 minute rounds as well as the security cameras for their protection. Patient is alert and oriented, warm and dry in no acute distress. Patient denies SI, HI, and AVH. Pt. Encouraged to let me know if needs arise. 

## 2019-06-03 NOTE — BH Assessment (Signed)
Referral information for Psychiatric Hospitalization faxed to;    [facility] [phone]),    [facility] [phone] or [phone])   [facility] [phone]),    [facility] [phone] -or- [phone]),    [facility] [phone] or [phone]),    [facility] [phone]).
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
## 2019-06-03 NOTE — ED Notes (Signed)
Introduced self to patient. Pt has vomited abuot 5 times since waking up this morning. Pt hooked up to cardiac monitoring.

## 2019-06-03 NOTE — ED Notes (Signed)
Hourly rounding reveals patient sleeping in room. No complaints, stable, in no acute distress. Q15 minute rounds and monitoring via Security Cameras to continue. 

## 2019-06-03 NOTE — ED Provider Notes (Addendum)
[facility] Emergency Department Provider Note  ____________________________________________   First MD Initiated Contact with Patient 06/03/19 0003     (approximate)  I have reviewed the triage vital signs and the nursing notes.   HISTORY  Chief Complaint Drug Overdose  Level 5 caveat: History is limited somewhat by the patient's allegedly intoxication (alcohol and/or medications) and acute medical and psychiatric condition.  HPI Megan Reed is a 24 y.o. female with a history of anxiety and depression who presents by EMS for decreased level of responsiveness and allegedly suicide attempt by medication overdose.  Details are somewhat vague and unclear at this time but I spoke in person with the [facility] police officer who responded to the call.  She reported to me that she personally saw text messages from the patient to at least one other individual in which she said goodbye.  It was unclear to me at the time whether or not she admitted in the text to take an overdose.  However there was another individual that was with her that reported to the police officer that she did in fact take nine 1-mg tablets of lorazepam over the period of a few hours and that she had expressed to him that she was going to kill herself.  After she became unresponsive he called 911.  EMS reports that she was responsive to them.  The patient is very upset and admits to depression.  She states that she was having a panic attack earlier and took 3 Ativan tablets and then was not feeling better so she took another 3.  I asked her if she has had thoughts about killing herself and she stated "I have to be careful how answer that".  She admits to ongoing depression that has been gradually getting worse over time and is currently severe.  She says she formally had a psychiatrist or therapist but no longer goes to one.  She has been under a great deal of stress with school.  When I asked her about the  "goodbye texts" to 1 or more individuals she states that she does not remember doing that but said "that sounds like something I could have done".   She states that she had some pain in her right flank earlier today but she took some over-the-counter pain medicine and the pain went away.  She currently denies chest pain, shortness of breath, nausea, and vomiting.  She feels upset and tired but otherwise has no complaints.  The symptoms are severe and the onset was apparently gradual in terms of the depression but the overdose tonight was acute.        Past Medical History:  Diagnosis Date  . Anxiety   . Depression     Patient Active Problem List   Diagnosis Date Noted  . Depression 06/03/2019  . Eating disorder 06/03/2019  . Intentional drug overdose (HCC)     History reviewed. No pertinent surgical history.  Prior to Admission medications   Medication Sig Start Date End Date Taking? Authorizing Provider  lisdexamfetamine (VYVANSE) 30 MG capsule Take 30 mg by mouth daily.    [provider]  lurasidone (LATUDA) 40 MG TABS tablet Take 40 mg by mouth daily with breakfast.    [provider]  sertraline (ZOLOFT) 25 MG tablet Take 25 mg by mouth daily.    [provider]    Allergies Patient has no known allergies.  No family history on file.  Social History Social History  Tobacco Use  . Smoking status: Current Every Day Smoker  . Smokeless tobacco: Never Used  Substance Use Topics  . Alcohol use: Yes  . Drug use: Yes    Types: Marijuana    Review of Systems Level 5 caveat: History is limited somewhat by the patient's allegedly intoxication (alcohol and/or medications) and acute medical and psychiatric condition.  Constitutional: No fever/chills Eyes: No visual changes. ENT: No sore throat. Cardiovascular: Denies chest pain. Respiratory: Denies shortness of breath. Gastrointestinal: No abdominal pain.  No nausea, no vomiting.  No  diarrhea.  No constipation. Genitourinary: Negative for dysuria. Musculoskeletal: Right flank pain earlier today, now resolved.  Negative for neck pain.  Negative for back pain. Integumentary: Negative for rash. Neurological: Negative for headaches, focal weakness or numbness. Psychiatric:  Depression with alleged suicide attempt by medication overdose.  ____________________________________________   PHYSICAL EXAM:  VITAL SIGNS: ED Triage Vitals  Enc Vitals Group     BP 06/02/19 2354 120/84     Pulse Rate 06/02/19 2354 96     Resp 06/02/19 2354 18     Temp 06/02/19 2354 98.3 F (36.8 C)     Temp Source 06/02/19 2354 Oral     SpO2 06/02/19 2354 96 %     Weight 06/02/19 2350 56.7 kg (125 lb)     Height 06/02/19 2350 1.702 m (5\' 7" )     Head Circumference --      Peak Flow --      Pain Score 06/02/19 2349 6     Pain Loc --      Pain Edu? --      Excl. in GC? --     Constitutional: Alert and oriented.  Eyes: Conjunctivae are normal.  Pupils are equal and reactive. Head: Atraumatic. Nose: No congestion/rhinnorhea. Mouth/Throat: Patient is wearing a mask. Neck: No stridor.  No meningeal signs.   Cardiovascular: Initially with mild tachycardia while she was crying and upset, then resolved, regular rhythm. Good peripheral circulation. Grossly normal heart sounds. Respiratory: Normal respiratory effort.  No retractions. Gastrointestinal: Soft and nontender. No distention.  Musculoskeletal: No lower extremity tenderness nor edema. No gross deformities of extremities. Neurologic:  Normal speech and language. No gross focal neurologic deficits are appreciated.  Skin:  Skin is warm, dry and intact. Psychiatric: Mood and affect are anxious and upset, tearful, admits to depression and is vague when asked about suicidal ideation.  ____________________________________________   LABS (all labs ordered are listed, but only abnormal results are displayed)  Labs Reviewed  URINALYSIS,  ROUTINE W REFLEX MICROSCOPIC - Abnormal; Notable for the following components:      Result Value   Hgb urine dipstick MODERATE (*)    Protein, ur TRACE (*)    Nitrite POSITIVE (*)    Leukocytes,Ua MODERATE (*)    All other components within normal limits  URINE DRUG SCREEN, QUALITATIVE ([facility] ONLY) - Abnormal; Notable for the following components:   Cannabinoid 50 Ng, Ur [place] POSITIVE (*)    Benzodiazepine, Ur Scrn POSITIVE (*)    All other components within normal limits  ACETAMINOPHEN LEVEL - Abnormal; Notable for the following components:   Acetaminophen (Tylenol), Serum <10 (*)    All other components within normal limits  SALICYLATE LEVEL - Abnormal; Notable for the following components:   Salicylate Lvl <7.0 (*)    All other components within normal limits  ETHANOL - Abnormal; Notable for the following components:   Alcohol, Ethyl (B) 178 (*)    All other  components within normal limits  URINALYSIS, MICROSCOPIC (REFLEX) - Abnormal; Notable for the following components:   Bacteria, UA FEW (*)    All other components within normal limits  RESPIRATORY PANEL BY RT PCR (FLU A&B, COVID)  URINE CULTURE  CBC WITH DIFFERENTIAL/PLATELET  COMPREHENSIVE METABOLIC PANEL  POC URINE PREG, ED   ____________________________________________  EKG  ED ECG REPORT I, Hinda Kehr, the attending physician, personally viewed and interpreted this ECG.  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  QTc 479 ms, QRS 117 ms ST/T Wave abnormalities: normal Narrative Interpretation: no evidence of acute ischemia  ____________________________________________  RADIOLOGY I, Hinda Kehr, personally viewed and evaluated these images (plain radiographs) as part of my medical decision making, as well as reviewing the written report by the radiologist.  ED MD interpretation: No indication  for emergent imaging  Official radiology report(s): No results found.  ____________________________________________   PROCEDURES   Procedure(s) performed (including Critical Care):  .1-3 Lead EKG Interpretation Performed by: Hinda Kehr, MD Authorized by: Hinda Kehr, MD     Interpretation: normal     ECG rate:  96   ECG rate assessment: normal     Rhythm: sinus rhythm     Ectopy: none     Conduction: normal       ____________________________________________   INITIAL IMPRESSION / MDM / ASSESSMENT AND PLAN / ED COURSE  As part of my medical decision making, I reviewed the following data within the [place] notes reviewed and incorporated, Labs reviewed , EKG interpreted , Old chart reviewed, A consult was requested and obtained from this/these consultant(s) Psychiatry, Notes from prior ED visits and [place] Controlled Substance Database   Differential diagnosis includes, but is not limited to, depression, suicidal ideation, accidental versus intentional overdose, electrolyte or metabolic abnormality, occult ingestion.  The patient admits to taking too many lorazepam but is vague about the intention.  However the [facility] police officer was quite clear that she saw evidence in the form of text messages and was told by at least one if not multiple individuals that the patient's intention was clear.  She is currently awake and alert and requires at least some observation but no further intervention should be necessary assuming that we do not find coingestions of salicylates or acetaminophen.  She gave me permission to speak with her parents if necessary or if they call.  They are reportedly on their way down from [place].  I have ordered psychiatry consult and standard lab work is pending.  Given the high probability that this was an intentional overdose as well as the history of depression, I will place the patient under involuntary commitment for her  safety.  The police officer was going to do it based on the evidence that she saw, but given the history I think it is appropriate for me to go ahead and do it as well as it will be more expeditious for me to follow the paperwork.  The patient is on the cardiac monitor to evaluate for evidence of arrhythmia and/or significant heart rate changes.     Clinical Course as of Jun 02 529  Sun Jun 03, 2019  0148 Alcohol, Ethyl (B)(!): 178 [CF]  [phone] Comprehensive metabolic panel is within normal limits. CBC is also within normal limits.  Comprehensive metabolic panel [CF]  2671 Patient is sleepy but appropriate.  She is medically  cleared at this time.  The patient has been placed in psychiatric observation due to the need to provide a safe environment for the patient while obtaining psychiatric consultation and evaluation, as well as ongoing medical and medication management to treat the patient's condition. The patient has been placed under full IVC at this time.   [CF]  [id number] Urinalysis notable for nitrite-positive UTI.  Ordering urine culture and ordered Keflex 500 mg PO BID x 7 days.  Patient will need prescription if she is discharged prior to receiving full treatment.   [CF]  0530 Labs otherwise unremarkable.   [CF]    Clinical Course User Index [CF] Loleta Rose, MD     ____________________________________________  FINAL CLINICAL IMPRESSION(S) / ED DIAGNOSES  Final diagnoses:  Depression, unspecified depression type  Intentional drug overdose, initial encounter [facility])  Alcoholic intoxication with complication (HCC)  Urinary tract infection without hematuria, site unspecified     MEDICATIONS GIVEN DURING THIS VISIT:  Medications  nicotine (NICODERM CQ - dosed in mg/24 hr) patch 7 mg (7 mg Transdermal Patch Applied 06/03/19 0132)  cephALEXin (KEFLEX) capsule 500 mg (has no administration in time range)     ED Discharge Orders    None      *Please note:  Megan Reed  was evaluated in Emergency Department on 06/03/2019 for the symptoms described in the history of present illness. She was evaluated in the context of the global COVID-19 pandemic, which necessitated consideration that the patient might be at risk for infection with the SARS-CoV-2 virus that causes COVID-19. Institutional protocols and algorithms that pertain to the evaluation of patients at risk for COVID-19 are in a state of rapid change based on information released by regulatory bodies including the CDC and federal and state organizations. These policies and algorithms were followed during the patient's care in the ED.  Some ED evaluations and interventions may be delayed as a result of limited staffing during the pandemic.*  Note:  This document was prepared using Dragon voice recognition software and may include unintentional dictation errors.   Loleta Rose, MD 06/03/19 3299    Loleta Rose, MD 06/03/19 [phone]

## 2019-06-03 NOTE — BH Assessment (Signed)
Assessment Note  Megan Reed is an 24 y.o. female. Megan Reed arrived to the ED by way of EMS.  She reported that she was unresponsive upon her arrival.  She stated, "I took too much of my medications.  I think I took 8 lorazepam and I was drinking vodka and tequila.  I was having a panic attack and it wouldn't go away, so I just kept taking them. I was feeling overwhelmed and kind lost.  I have been feeling this way for the past 3 weeks. It has been stressful because I will not be graduating with my class. My ex-boyfriend is toxic and so was our relationship.  I am feeling hopeless. "I just wanted to go to sleep, I did not think this would happen.  Megan Reed reports being depressed.  She reports a decreased appetite, increase sleep, increase in the amount of worrying, she has been isolating herself. She expressed increased feelings of worthlessness.  She reports an increase in suicidal ideation. She reports an increase in her anxiety symptoms.   She denied having auditory or visual hallucinations She denied homicidal ideation or intent.  She reported, "I would never do anything like that".  She shared that her cousin committed suicide about 2 years ago. In addition, She reports a history of eating disorders.  Diagnosis: Depression, Anxiety  Past Medical History:  Past Medical History:  Diagnosis Date  . Anxiety   . Depression     History reviewed. No pertinent surgical history.  Family History: No family history on file.  Social History:  reports that she has been smoking. She has never used smokeless tobacco. She reports current alcohol use. She reports current drug use. Drug: Marijuana.  Additional Social History:  Alcohol / Drug Use History of alcohol / drug use?: Yes Substance #1 Name of Substance 1: Alcohol 1 - Age of First Use: Unsure 1 - Amount (size/oz): Unsure 1 - Frequency: 4 days a week 1 - Last Use / Amount: 06/02/2019 Substance #2 Name of Substance 2: Marijauana 2 - Age of First  Use: Unsure 2 - Amount (size/oz): Unsure 2 - Frequency: 4 days a week 2 - Last Use / Amount: 06/02/2019  CIWA: CIWA-Ar BP: (!) 134/97 Pulse Rate: (!) 109 COWS:    Allergies: No Known Allergies  Home Medications: (Not in a hospital admission)   OB/GYN Status:  Patient's last menstrual period was 05/12/2019 (approximate).  General Assessment Data Location of Assessment: [facility] ED TTS Assessment: In system Is this a Tele or Face-to-Face Assessment?: Face-to-Face Is this an Initial Assessment or a Re-assessment for this encounter?: Initial Assessment Patient Accompanied by:: N/A Language Other than English: No Living Arrangements: (Private residence) What gender do you identify as?: Female Marital status: Single Living Arrangements: Non-relatives/Friends Can pt return to current living arrangement?: Yes Admission Status: Involuntary Petitioner: ED Attending Is patient capable of signing voluntary admission?: No Referral Source: Self/Family/Friend Insurance type: [place] Screening Exam ([place]) Medical Exam completed: Yes  Crisis Care Plan Living Arrangements: Non-relatives/Friends Legal Guardian: Other:(Self) Name of Psychiatrist: Norva Pavlov - In [place] Name of Therapist: None  Education Status Is patient currently in school?: Yes([employer] Student) Current Grade: [place] Name of school: [employer]  Risk to self with the past 6 months Suicidal Ideation: No Has patient been a risk to self within the past 6 months prior to admission? : No Suicidal Intent: No Has patient had any suicidal intent within the past 6 months prior to admission? : No Is  patient at risk for suicide?: Yes Suicidal Plan?: No-Not Currently/Within Last 6 Months Has patient had any suicidal plan within the past 6 months prior to admission? : No Access to Means: No What has been your use of drugs/alcohol within the last 12 months?: Use of alcohol 4 days a week Previous  Attempts/Gestures: No How many times?: 0 Other Self Harm Risks: denied Triggers for Past Attempts: None known Intentional Self Injurious Behavior: None Family Suicide History: Yes(Cousin committed suicide, Mother and Sister attempted suicid) Recent stressful life event(s): Other (Comment)(Not graduating with her friends, Toxic relationship) Persecutory voices/beliefs?: No Depression: Yes Depression Symptoms: Feeling worthless/self pity, Loss of interest in usual pleasures Substance abuse history and/or treatment for substance abuse?: Yes Suicide prevention information given to non-admitted patients: Not applicable  Risk to Others within the past 6 months Homicidal Ideation: No Does patient have any lifetime risk of violence toward others beyond the six months prior to admission? : No Thoughts of Harm to Others: No Current Homicidal Intent: No Current Homicidal Plan: No Access to Homicidal Means: No Identified Victim: None identified History of harm to others?: No Assessment of Violence: None Noted Does patient have access to weapons?: No Criminal Charges Pending?: No Does patient have a court date: No Is patient on probation?: No  Psychosis Hallucinations: None noted Delusions: None noted  Mental Status Report Appearance/Hygiene: Unremarkable Eye Contact: Poor Motor Activity: Unremarkable Speech: Logical/coherent Level of Consciousness: Drowsy Mood: Euthymic Affect: Appropriate to circumstance Anxiety Level: None Thought Processes: Coherent Judgement: Partial Orientation: Appropriate for developmental age Obsessive Compulsive Thoughts/Behaviors: None  Cognitive Functioning Concentration: Fair Memory: Unable to Assess Is patient IDD: No Insight: Poor Impulse Control: Fair Appetite: Poor Have you had any weight changes? : Loss Amount of the weight change? (lbs): 12 lbs Sleep: Decreased  ADLScreening [facility] Assessment Services) Patient's cognitive ability adequate  to safely complete daily activities?: Yes Patient able to express need for assistance with ADLs?: Yes Independently performs ADLs?: Yes (appropriate for developmental age)  Prior Inpatient Therapy Prior Inpatient Therapy: No  Prior Outpatient Therapy Prior Outpatient Therapy: Yes Prior Therapy Dates: Current Prior Therapy Facilty/Provider(s): [facility] Reason for Treatment: Depression Does patient have an ACCT team?: No Does patient have Intensive In-House Services?  : No Does patient have [facility] services? : No Does patient have P4CC services?: No  ADL Screening (condition at time of admission) Patient's cognitive ability adequate to safely complete daily activities?: Yes Is the patient deaf or have difficulty hearing?: No Does the patient have difficulty seeing, even when wearing glasses/contacts?: No Does the patient have difficulty concentrating, remembering, or making decisions?: No Patient able to express need for assistance with ADLs?: Yes Does the patient have difficulty dressing or bathing?: No Independently performs ADLs?: Yes (appropriate for developmental age) Does the patient have difficulty walking or climbing stairs?: No Weakness of Legs: None Weakness of Arms/Hands: None  Home Assistive Devices/Equipment Home Assistive Devices/Equipment: None    Abuse/Neglect Assessment (Assessment to be complete while patient is alone) Abuse/Neglect Assessment Can Be Completed: (denied a history of abuse)     Advance Directives (For Healthcare) Does Patient Have a Medical Advance Directive?: No          Disposition:  Disposition Initial Assessment Completed for this Encounter: Yes  On Site Evaluation by:   Reviewed with Physician:    Justice Deeds 06/03/2019 3:57 AM

## 2019-06-03 NOTE — ED Provider Notes (Signed)
Patient had episode of vomiting.  Will give ODT Zofran and monitor closely   Jene Every, MD 06/03/19 [phone]

## 2019-06-03 NOTE — ED Notes (Addendum)
Pt changed into behavorial clothing by this tech and RN Selena Batten, the following items obtained are as follows...Marland KitchenMarland KitchenMarland Kitchen  1 yellow gold ring (pt states that other one fell out today) 1 yellow necklace with circle yellow charm, 1 beaded bracelette that says "sister" w/ yellow circle charm  1 beaded bracelette that says "strong AF" w/ yellow circle charm 1 yellow ring with crown & hands holding a heart 1 yellow adjustable bracelette with 2 yellow charms 1 silver bracelette with 3 silver charms on it 1 orange long sleeve t-shirt 1 white and blue pair of sweat pants

## 2019-06-03 NOTE — BH Assessment (Signed)
Patient has been accepted to [facility].  Patient assigned to Adult [facility] Accepting physician is Dr. Estill Cotta.  Call report to [phone].  Representative was [name].   ER Staff is aware of it:  Ronnie, ER Secretary  Dr. Larinda Buttery, ER MD  Selena Batten, Patient's Nurse   Address: [address] [place], [place] [address]  Patient bed is available tomorrow (06/04/2019) anytime after 9am

## 2019-06-03 NOTE — ED Notes (Signed)
Pt has had a second cup of water. Pt denies any nausea at this time.

## 2019-06-03 NOTE — ED Notes (Signed)
Pt given crackers and peanut butter to try to keep down. Will attempt to give antibiotic if tolerating.

## 2019-06-03 NOTE — ED Notes (Signed)
Pt cleared by Dr. Cyril Loosen. OK to move back to [facility].

## 2019-06-03 NOTE — ED Notes (Addendum)
Darletta Moll, NP @ pt bedside at this time

## 2019-06-03 NOTE — ED Notes (Addendum)
Pt able to tolerate water at this time. Pt states that this happens often when she wakes up in the morning. Pt has had one cup of water.

## 2019-06-03 NOTE — Consult Note (Addendum)
Megan Reed was seen and evaluated by nurse practitioner and TTS counselor.  She continues to endorse depression and suicidal ideations.  She reports " I have always been a depressed kid."  She reported previous suicide attempts in the past.  She reported taking too many Ativan. "like 7 pills."  Staff reports concerns of nausea and vomiting during daily assessment.  Patient to be evaluated by medical staff. -Patient accepted to [facility]. After medical clearances.  Support,encouragement and reassurance was provided.

## 2019-06-03 NOTE — ED Notes (Signed)
Patient gave this Clinical research associate permission to give mother Megan Reed updates on patient's status.

## 2019-06-03 NOTE — ED Notes (Signed)
This RN received call from first nurse stating that the pt had 2 visitors, mother and sister. Pt asked which visitor she would like to see. Pt stated she wanted to see her sister. Pt allowed a 15 min visit with sister.

## 2019-06-04 NOTE — ED Notes (Signed)
Patient given her Ativan 1mg  (11 tablets) back that she brought from home pharmacy form on chart  is aware she has them

## 2019-06-04 NOTE — ED Provider Notes (Signed)
Emergency Medicine Observation Re-evaluation Note  Megan Reed is a 24 y.o. female, seen on rounds today.  Pt initially presented to the ED for complaints of Drug Overdose Currently, the patient is no acute distress  Physical Exam  BP 126/75 (BP Location: Left Arm)   Pulse 93   Temp 98.2 F (36.8 C) (Oral)   Resp 17   Ht 5\' 7"  (1.702 m)   Wt 56.7 kg   LMP 05/12/2019 (Approximate)   SpO2 100%   BMI 19.58 kg/m  Physical Exam  ED Course / MDM  EKG:  Clinical Course as of Jun 04 802  Sun Jun 03, 2019  0148 Alcohol, Ethyl (B)(!): 178 [CF]  [phone] Comprehensive metabolic panel is within normal limits. CBC is also within normal limits.  Comprehensive metabolic panel [CF]  4967 Patient is sleepy but appropriate.  She is medically cleared at this time.  The patient has been placed in psychiatric observation due to the need to provide a safe environment for the patient while obtaining psychiatric consultation and evaluation, as well as ongoing medical and medication management to treat the patient's condition. The patient has been placed under full IVC at this time.   [CF]  [id number] Urinalysis notable for nitrite-positive UTI.  Ordering urine culture and ordered Keflex 500 mg PO BID x 7 days.  Patient will need prescription if she is discharged prior to receiving full treatment.   [CF]  0530 Labs otherwise unremarkable.   [CF]    Clinical Course User Index [CF] [id number], MD   I have reviewed the labs performed to date as well as medications administered while in observation.  Plan  Current plan is for pending transfer Patient is under full IVC at this time.   Loleta Rose, MD 06/04/19 [phone]

## 2019-06-04 NOTE — ED Notes (Signed)
Patient transferred to [facility] with [facility], patient and [facility] received transfer papers. Patient received belongings and verbalized she has received all of her belongings. Patient appropriate and cooperative, Denies SI/HI AVH. Vital signs taken. NAD noted.

## 2019-06-04 NOTE — ED Notes (Signed)
Patients mother Megan Reed notified that patient has been discharged to [facility]

## 2019-06-04 NOTE — ED Notes (Signed)
Hourly rounding reveals patient sleeping in room. No complaints, stable, in no acute distress. Q15 minute rounds and monitoring via Security Cameras to continue. 

## 2019-06-05 LAB — URINE CULTURE
Culture: >=100000 — AB
Special Requests: NORMAL

## 2019-11-29 ENCOUNTER — Other Ambulatory Visit (HOSPITAL_COMMUNITY)
Admission: RE | Admit: 2019-11-29 | Discharge: 2019-11-29 | Disposition: A | Discharge status: Home / Self Care | Payer: No Typology Code available for payment source | Source: Ambulatory Visit | Attending: Obstetrics and Gynecology | Admitting: Obstetrics and Gynecology

## 2019-11-29 ENCOUNTER — Encounter: Payer: Self-pay | Admitting: Obstetrics and Gynecology

## 2019-11-29 ENCOUNTER — Other Ambulatory Visit: Payer: Self-pay

## 2019-11-29 ENCOUNTER — Ambulatory Visit (INDEPENDENT_AMBULATORY_CARE_PROVIDER_SITE_OTHER): Payer: 59 | Admitting: Obstetrics and Gynecology

## 2019-11-29 VITALS — BP 90/60 | Ht 68.0 in | Wt 127.0 lb

## 2019-11-29 DIAGNOSIS — Z30431 Encounter for routine checking of intrauterine contraceptive device: Secondary | ICD-10-CM

## 2019-11-29 DIAGNOSIS — Z113 Encounter for screening for infections with a predominantly sexual mode of transmission: Secondary | ICD-10-CM | POA: Diagnosis present

## 2019-11-29 DIAGNOSIS — T192XXA Foreign body in vulva and vagina, initial encounter: Secondary | ICD-10-CM

## 2019-11-29 NOTE — Patient Instructions (Signed)
I value your feedback and entrusting us with your care. If you get a [facility] patient survey, I would appreciate you taking the time to let us know about your experience today. Thank you!  As of January 18, 2019, your lab results will be released to your MyChart immediately, before I even have a chance to see them. Please give me time to review them and contact you if there are any abnormalities. Thank you for your patience.  

## 2019-11-29 NOTE — Progress Notes (Signed)
Patient, No Pcp Per   Chief Complaint  Patient presents with  . Vaginal Discharge    sour odor, no itchiness/irritation x 3 days    HPI:      [name] is a 24 y.o. G2P0010 whose LMP was Patient's last menstrual period was 11/19/2019 (approximate)., presents today for NP eval of increased vag d/c with sour odor, no irritation for 3 days. Hx of BV in past. No meds to treat. Was on abx a couple times this summer for UTIs. No urin sx now, no pelvic pain, fevers.  She is sex active, has a new partner. Has kyleena, placed about 3 yrs ago. Has monthly menses.   Past Medical History:  Diagnosis Date  . Anorexia   . Anxiety   . Depression     History reviewed. No pertinent surgical history.  Family History  Problem Relation Age of Onset  . Heart attack Father   . Breast cancer Paternal Aunt 9    Social History   Socioeconomic History  . Marital status: Single    Spouse name: Not on file  . Number of children: Not on file  . Years of education: Not on file  . Highest education level: Not on file  Occupational History  . Not on file  Tobacco Use  . Smoking status: Current Every Day Smoker  . Smokeless tobacco: Never Used  Vaping Use  . Vaping Use: Never used  Substance and Sexual Activity  . Alcohol use: Yes  . Drug use: Yes    Types: Marijuana  . Sexual activity: Yes    Birth control/protection: I.U.D.    Comment: Kyleena  Other Topics Concern  . Not on file  Social History Narrative  . Not on file   Social Determinants of Health   Financial Resource Strain:   . Difficulty of Paying Living Expenses: Not on file  Food Insecurity:   . Worried About Programme researcher, broadcasting/film/video in the Last Year: Not on file  . Ran Out of Food in the Last Year: Not on file  Transportation Needs:   . Lack of Transportation (Medical): Not on file  . Lack of Transportation (Non-Medical): Not on file  Physical Activity:   . Days of Exercise per Week: Not on file  . Minutes of  Exercise per Session: Not on file  Stress:   . Feeling of Stress : Not on file  Social Connections:   . Frequency of Communication with Friends and Family: Not on file  . Frequency of Social Gatherings with Friends and Family: Not on file  . Attends Religious Services: Not on file  . Active Member of Clubs or Organizations: Not on file  . Attends Banker Meetings: Not on file  . Marital Status: Not on file  Intimate Partner Violence:   . Fear of Current or Ex-Partner: Not on file  . Emotionally Abused: Not on file  . Physically Abused: Not on file  . Sexually Abused: Not on file    Outpatient Medications Prior to Visit  Medication Sig Dispense Refill  . EVEKEO 5 MG TABS Take 1 tablet by mouth at bedtime.    . Fluvoxamine Maleate 100 MG CP24 Take 1 capsule by mouth at bedtime.    . hydrOXYzine (VISTARIL) 25 MG capsule Take 25 mg by mouth every 6 (six) hours as needed.    . LamoTRIgine 100 MG TB24 24 hour tablet Take 1 tablet by mouth daily.    Marland Kitchen  lamoTRIgine 50 MG TBDP Take 1 tablet by mouth daily.    Marland Kitchen levonorgestrel (KYLEENA) 19.5 MG IUD by Intrauterine route once. Approx. August 2018    . QUEtiapine (SEROQUEL) 50 MG tablet Take 50 mg by mouth at bedtime.    Marland Kitchen VYVANSE 40 MG capsule Take 40 mg by mouth every morning.    . methylphenidate (RITALIN) 5 MG tablet  (Patient not taking: Reported on 11/29/2019)    . Brexpiprazole (REXULTI) 4 MG TABS Take 0.5 tablets by mouth daily.     Marland Kitchen escitalopram (LEXAPRO) 20 MG tablet Take 20 mg by mouth daily.    Marland Kitchen lisdexamfetamine (VYVANSE) 30 MG capsule Take 30 mg by mouth daily.    Marland Kitchen LORazepam (ATIVAN) 1 MG tablet Take 1 mg by mouth daily as needed.    . lurasidone (LATUDA) 40 MG TABS tablet Take 40 mg by mouth daily with breakfast.    . sertraline (ZOLOFT) 25 MG tablet Take 25 mg by mouth daily.     No facility-administered medications prior to visit.      ROS:  Review of Systems  Constitutional: Negative for fever.    Gastrointestinal: Negative for blood in stool, constipation, diarrhea, nausea and vomiting.  Genitourinary: Positive for vaginal discharge. Negative for dyspareunia, dysuria, flank pain, frequency, hematuria, urgency, vaginal bleeding and vaginal pain.  Musculoskeletal: Negative for back pain.  Skin: Negative for rash.  BREAST: No symptoms   OBJECTIVE:   Vitals:  BP 90/60   Ht 5\' 8"  (1.727 m)   Wt 127 lb (57.6 kg)   LMP 11/19/2019 (Approximate)   BMI 19.31 kg/m   Physical Exam Vitals reviewed.  Constitutional:      Appearance: She is well-developed.  Pulmonary:     Effort: Pulmonary effort is normal.  Genitourinary:    General: Normal vulva.     Pubic Area: No rash.      Labia:        Right: No rash, tenderness or lesion.        Left: No rash, tenderness or lesion.      Vagina: Normal. No vaginal discharge, erythema or tenderness.     Cervix: Normal.     Uterus: Normal. Not enlarged and not tender.      Adnexa: Right adnexa normal and left adnexa normal.       Right: No mass or tenderness.         Left: No mass or tenderness.       Comments: RETAINED TAMPON ON EXAM; IUD STRINGS IN CX OS Musculoskeletal:        General: Normal range of motion.     Cervical back: Normal range of motion.  Skin:    General: Skin is warm and dry.  Neurological:     General: No focal deficit present.     Mental Status: She is alert and oriented to person, place, and time.  Psychiatric:        Mood and Affect: Mood normal.        Behavior: Behavior normal.        Thought Content: Thought content normal.        Judgment: Judgment normal.     Assessment/Plan: Retained tampon, initial encounter--tampon removed, exam neg afterwards. Discussed s/s TSS. F/u prn.   Screening for STD (sexually transmitted disease) - Plan: Cervicovaginal ancillary only  Encounter for routine checking of intrauterine contraceptive device (IUD)--IUD strings in cx os.    Return if symptoms worsen or fail  to improve.  Lorence Nagengast B. Evalin Shawhan, PA-C 11/29/2019 4:29 PM

## 2019-12-02 ENCOUNTER — Emergency Department: Payer: PRIVATE HEALTH INSURANCE

## 2019-12-02 ENCOUNTER — Emergency Department
Admission: EM | Admit: 2019-12-02 | Discharge: 2019-12-02 | Disposition: A | Discharge status: Home / Self Care | Payer: PRIVATE HEALTH INSURANCE | Attending: Emergency Medicine | Admitting: Emergency Medicine

## 2019-12-02 ENCOUNTER — Other Ambulatory Visit: Payer: Self-pay

## 2019-12-02 DIAGNOSIS — F17200 Nicotine dependence, unspecified, uncomplicated: Secondary | ICD-10-CM | POA: Insufficient documentation

## 2019-12-02 DIAGNOSIS — N760 Acute vaginitis: Secondary | ICD-10-CM | POA: Diagnosis not present

## 2019-12-02 DIAGNOSIS — N898 Other specified noninflammatory disorders of vagina: Secondary | ICD-10-CM

## 2019-12-02 DIAGNOSIS — B9689 Other specified bacterial agents as the cause of diseases classified elsewhere: Secondary | ICD-10-CM

## 2019-12-02 DIAGNOSIS — R102 Pelvic and perineal pain: Secondary | ICD-10-CM | POA: Diagnosis present

## 2019-12-02 LAB — URINALYSIS, COMPLETE (UACMP) WITH MICROSCOPIC
Bilirubin Urine: NEGATIVE
Glucose, UA: NEGATIVE mg/dL
Hgb urine dipstick: NEGATIVE
Ketones, ur: NEGATIVE mg/dL
Nitrite: NEGATIVE
Protein, ur: 30 mg/dL — AB
Specific Gravity, Urine: 1.025 (ref 1.005–1.030)
WBC, UA: >50 WBC/hpf — ABNORMAL HIGH (ref 0–5)
pH: 7 (ref 5.0–8.0)

## 2019-12-02 LAB — BASIC METABOLIC PANEL
Anion gap: 10 (ref 5–15)
BUN: 15 mg/dL (ref 6–20)
CO2: 28 mmol/L (ref 22–32)
Calcium: 9.2 mg/dL (ref 8.9–10.3)
Chloride: 98 mmol/L (ref 98–111)
Creatinine, Ser: 0.79 mg/dL (ref 0.44–1.00)
GFR, Estimated: >60 mL/min (ref >60)
Glucose, Bld: 118 mg/dL — ABNORMAL HIGH (ref 70–99)
Potassium: 4.1 mmol/L (ref 3.5–5.1)
Sodium: 136 mmol/L (ref 135–145)

## 2019-12-02 LAB — LACTIC ACID, PLASMA: Lactic Acid, Venous: 1 mmol/L (ref 0.5–1.9)

## 2019-12-02 LAB — CBC
HCT: 39.3 % (ref 36.0–46.0)
Hemoglobin: 13.5 g/dL (ref 12.0–15.0)
MCH: 32.8 pg (ref 26.0–34.0)
MCHC: 34.4 g/dL (ref 30.0–36.0)
MCV: 95.6 fL (ref 80.0–100.0)
Platelets: 273 10*3/uL (ref 150–400)
RBC: 4.11 MIL/uL (ref 3.87–5.11)
RDW: 12.7 % (ref 11.5–15.5)
WBC: 4.8 10*3/uL (ref 4.0–10.5)
nRBC: 0 % (ref 0.0–0.2)

## 2019-12-02 LAB — CHLAMYDIA/NGC RT PCR (ARMC ONLY)
Chlamydia Tr: NOT DETECTED
N gonorrhoeae: NOT DETECTED

## 2019-12-02 LAB — WET PREP, GENITAL
Sperm: NONE SEEN
Trich, Wet Prep: NONE SEEN
Yeast Wet Prep HPF POC: NONE SEEN

## 2019-12-02 LAB — PREGNANCY, URINE: Preg Test, Ur: NEGATIVE

## 2019-12-02 MED ORDER — DOXYCYCLINE HYCLATE 100 MG PO TABS
100.0000 mg | ORAL_TABLET | Freq: Once | ORAL | Status: AC
  Administered 2019-12-02: 100 mg via ORAL
  Filled 2019-12-02: qty 1

## 2019-12-02 MED ORDER — METRONIDAZOLE 500 MG PO TABS
500.0000 mg | ORAL_TABLET | Freq: Once | ORAL | Status: AC
  Administered 2019-12-02: 500 mg via ORAL
  Filled 2019-12-02: qty 1

## 2019-12-02 MED ORDER — ONDANSETRON 4 MG PO TBDP: 4.0000 mg | ORAL_TABLET | Freq: Three times a day (TID) | ORAL | 0 refills | Status: AC | PRN

## 2019-12-02 MED ORDER — METRONIDAZOLE 500 MG PO TABS: 500.0000 mg | ORAL_TABLET | Freq: Two times a day (BID) | ORAL | 0 refills | Status: AC

## 2019-12-02 MED ORDER — SODIUM CHLORIDE 0.9 % IV SOLN
1.0000 g | Freq: Once | INTRAVENOUS | Status: AC
  Administered 2019-12-02: 1 g via INTRAVENOUS
  Filled 2019-12-02: qty 10

## 2019-12-02 MED ORDER — DOXYCYCLINE MONOHYDRATE 100 MG PO CAPS: 100.0000 mg | ORAL_CAPSULE | Freq: Two times a day (BID) | ORAL | 0 refills | Status: AC

## 2019-12-02 MED ORDER — IBUPROFEN 400 MG PO TABS
400.0000 mg | ORAL_TABLET | Freq: Once | ORAL | Status: AC | PRN
  Administered 2019-12-02: 400 mg via ORAL
  Filled 2019-12-02: qty 1

## 2019-12-02 NOTE — ED Triage Notes (Signed)
PT to ED via POV from home. PT had unknowingly had a tampon in from Saturday night and had intercourse multiple times with tampon in. PT saw her GYN on THur and had it removed. Since pt has had sore throat, fatigue, and states she feels like she has come close to fainting a few time. PT also endorses abd pain described as "super tender" starting 2 days ago.

## 2019-12-02 NOTE — ED Notes (Signed)
Patient transported to Ultrasound 

## 2019-12-02 NOTE — ED Provider Notes (Addendum)
[facility] Emergency Department Provider Note  ____________________________________________  Time seen: Approximately 8:56 PM  I have reviewed the triage vital signs and the nursing notes.   HISTORY  Chief Complaint Near Syncope    HPI Megan Reed is a 24 y.o. female with a history of anxiety depression who comes the ED complaining of diffuse lower abdominal pain and pelvic pain, worsening over the past several days.  About a week ago she inadvertently left a tampon in, had unprotected sex multiple times with her boyfriend through the week, and then developed worsening vaginal discharge.  She went to gynecology 3 days ago who did an exam, retrieved the tampon, sent GC chlamydia which results are not back yet.   Symptoms are constant without aggravating or alleviating factors.  No fever or dizziness.   I reviewed their note, they also found that her IUD strings were in place.     Past Medical History:  Diagnosis Date  . Anorexia   . Anxiety   . Depression      Patient Active Problem List   Diagnosis Date Noted  . Depression 06/03/2019  . Eating disorder 06/03/2019  . Intentional drug overdose (HCC)      History reviewed. No pertinent surgical history.   Prior to Admission medications   Medication Sig Start Date End Date Taking? Authorizing Provider  doxycycline (MONODOX) 100 MG capsule Take 1 capsule (100 mg total) by mouth 2 (two) times daily for 10 days. 12/02/19 12/12/19  Sharman CheekStafford, Tynisha Ogan, MD  EVEKEO 5 MG TABS Take 1 tablet by mouth at bedtime. 05/15/19   [provider]  Fluvoxamine Maleate 100 MG CP24 Take 1 capsule by mouth at bedtime. 10/25/19   [provider]  hydrOXYzine (VISTARIL) 25 MG capsule Take 25 mg by mouth every 6 (six) hours as needed. 07/05/19   [provider]  LamoTRIgine 100 MG TB24 24 hour tablet Take 1 tablet by mouth daily. 11/21/19   [provider]  lamoTRIgine 50 MG TBDP Take 1  tablet by mouth daily. 11/22/19   [provider]  levonorgestrel (KYLEENA) 19.5 MG IUD by Intrauterine route once. Approx. August 2018    [provider]  methylphenidate (RITALIN) 5 MG tablet  11/08/13   [provider]  metroNIDAZOLE (FLAGYL) 500 MG tablet Take 1 tablet (500 mg total) by mouth 2 (two) times daily. 12/02/19   Sharman CheekStafford, Darris Carachure, MD  ondansetron (ZOFRAN ODT) 4 MG disintegrating tablet Take 1 tablet (4 mg total) by mouth every 8 (eight) hours as needed for nausea or vomiting. 12/02/19   Sharman CheekStafford, Mikaeel Petrow, MD  QUEtiapine (SEROQUEL) 50 MG tablet Take 50 mg by mouth at bedtime. 07/05/19   [provider]  VYVANSE 40 MG capsule Take 40 mg by mouth every morning. 10/24/19   [provider]     Allergies Patient has no known allergies.   Family History  Problem Relation Age of Onset  . Heart attack Father   . Breast cancer Paternal Aunt [id number]    Social History Social History   Tobacco Use  . Smoking status: Current Every Day Smoker  . Smokeless tobacco: Never Used  Vaping Use  . Vaping Use: Never used  Substance Use Topics  . Alcohol use: Yes  . Drug use: Yes    Types: Marijuana    Review of Systems  Constitutional:   No fever or chills.  ENT:   No sore throat. No rhinorrhea. Cardiovascular:   No chest pain or syncope.  Respiratory:   No dyspnea or cough. Gastrointestinal: Positive as above for lower abdominal pain without vomiting and diarrhea.  Musculoskeletal:   Negative for focal pain or swelling All other systems reviewed and are negative except as documented above in ROS and HPI.  ____________________________________________   PHYSICAL EXAM:  VITAL SIGNS: ED Triage Vitals  Enc Vitals Group     BP 12/02/19 1902 114/75     Pulse Rate 12/02/19 1902 85     Resp 12/02/19 1902 20     Temp 12/02/19 1902 98.3 F (36.8 C)     Temp Source 12/02/19 1902 Oral     SpO2 12/02/19 1902 99 %     Weight 12/02/19 1905 125  lb (56.7 kg)     Height 12/02/19 1905 5\' 7"  (1.702 m)     Head Circumference --      Peak Flow --      Pain Score 12/02/19 1905 6     Pain Loc --      Pain Edu? --      Excl. in GC? --     Vital signs reviewed, nursing assessments reviewed.   Constitutional:   Alert and oriented. Non-toxic appearance. Eyes:   Conjunctivae are normal. EOMI. PERRL. ENT      Head:   Normocephalic and atraumatic.      Nose:   Wearing a mask.      Mouth/Throat:   Wearing a mask.      Neck:   No meningismus. Full ROM. Hematological/Lymphatic/Immunilogical:   No cervical lymphadenopathy. Cardiovascular:   RRR. Symmetric bilateral radial and DP pulses.  No murmurs. Cap refill less than 2 seconds. Respiratory:   Normal respiratory effort without tachypnea/retractions. Breath sounds are clear and equal bilaterally. No wheezes/rales/rhonchi. Gastrointestinal:   Soft with diffuse lower abdominal tenderness. Non distended.  No rebound, rigidity, or guarding. Genitourinary:   Pelvic exam performed with nurse at bedside, external exam unremarkable.  Speculum exam shows purulent discharge in the vault.  iud strings in place.  There is CMT.  There is adnexal tenderness without mass on bimanual exam. Musculoskeletal:   Normal range of motion in all extremities. No joint effusions.  No lower extremity tenderness.  No edema. Neurologic:   Normal speech and language.  Motor grossly intact. No acute focal neurologic deficits are appreciated.  Skin:    Skin is warm, dry and intact. No rash noted.  No petechiae, purpura, or bullae.  ____________________________________________    LABS (pertinent positives/negatives) (all labs ordered are listed, but only abnormal results are displayed) Labs Reviewed  WET PREP, GENITAL - Abnormal; Notable for the following components:      Result Value   Clue Cells Wet Prep HPF POC PRESENT (*)    WBC, Wet Prep HPF POC FEW (*)    All other components within normal limits  BASIC  METABOLIC PANEL - Abnormal; Notable for the following components:   Glucose, Bld 118 (*)    All other components within normal limits  URINALYSIS, COMPLETE (UACMP) WITH MICROSCOPIC - Abnormal; Notable for the following components:   Color, Urine YELLOW (*)    APPearance CLOUDY (*)    Protein, ur 30 (*)    Leukocytes,Ua MODERATE (*)    WBC, UA >50 (*)    Bacteria, UA FEW (*)    All other components within normal limits  CHLAMYDIA/NGC RT PCR ([facility] ONLY)  CBC  LACTIC ACID, PLASMA  PREGNANCY, URINE  POC URINE PREG, ED   ____________________________________________  EKG Interpreted by me nsr rate 84. Nl axis, intervals. Incomplete rbbb. Nl ST segments and T waves   ____________________________________________    RADIOLOGY  US PELVIC COMPLETE WITH TRANSVAGINAL  Result Date: 12/02/2019 CLINICAL DATA:  Pelvic pain and discharge EXAM: TRANSABDOMINAL AND TRANSVAGINAL ULTRASOUND OF PELVIS TECHNIQUE: Both transabdominal and transvaginal ultrasound examinations of the pelvis were performed. Transabdominal technique was performed for global imaging of the pelvis including uterus, ovaries, adnexal regions, and pelvic cul-de-sac. It was necessary to proceed with endovaginal exam following the transabdominal exam to visualize the ovaries. COMPARISON:  None FINDINGS: Uterus Measurements: 6.9 x 4.6 x 5.4 cm = volume: 8.9 mL. No fibroids or other mass visualized. Endometrium Thickness: 1.8 mm no focal abnormality visualized. IUD seen within the endometrial canal. Right ovary Measurements: 3.6 x 1.8 x 2.8 cm = volume: 9 mL. Normal appearance/no adnexal mass. Left ovary Measurements: 3.3 x 2.2 x 2.2 cm = volume: 9 mL. Normal appearance/no adnexal mass. Other findings No abnormal free fluid. IMPRESSION: IUD within the endometrial canal. Normal appearing ovaries Electronically Signed   By: Jonna Clark M.D.   On: 12/02/2019 22:18     ____________________________________________   PROCEDURES Procedures  ____________________________________________  DIFFERENTIAL DIAGNOSIS   Ovarian cyst, TOA, PID, staph/strep infection  CLINICAL IMPRESSION / ASSESSMENT AND PLAN / ED COURSE  Medications ordered in the ED: Medications  doxycycline (VIBRA-TABS) tablet 100 mg (has no administration in time range)  metroNIDAZOLE (FLAGYL) tablet 500 mg (has no administration in time range)  ibuprofen (ADVIL) tablet 400 mg (400 mg Oral Given 12/02/19 1927)  cefTRIAXone (ROCEPHIN) 1 g in sodium chloride 0.9 % 100 mL IVPB (0 g Intravenous Stopped 12/02/19 2136)    Pertinent labs & imaging results that were available during my care of the patient were reviewed by me and considered in my medical decision making (see chart for details).  Megan Reed was evaluated in Emergency Department on 12/02/2019 for the symptoms described in the history of present illness. She was evaluated in the context of the global COVID-19 pandemic, which necessitated consideration that the patient might be at risk for infection with the SARS-CoV-2 virus that causes COVID-19. Institutional protocols and algorithms that pertain to the evaluation of patients at risk for COVID-19 are in a state of rapid change based on information released by regulatory bodies including the CDC and federal and state organizations. These policies and algorithms were followed during the patient's care in the ED.   Patient presents with persistent purulent discharge and pain despite foreign body removal 3 days ago.  Will obtain pelvic ultrasound, give IV Rocephin, plan to treat for PID.  She is nontoxic with normal vital signs and labs.   ----------------------------------------- 10:54 PM on 12/02/2019 -----------------------------------------  Ultrasound unremarkable.  Vital signs remain normal.  Wet prep positive for bacterial vaginosis.  STI testing negative.  Given the recent  retained foreign body will treat with doxycycline and Flagyl.     ____________________________________________   FINAL CLINICAL IMPRESSION(S) / ED DIAGNOSES    Final diagnoses:  Pelvic pain  Bacterial vaginosis     ED Discharge Orders         Ordered    metroNIDAZOLE (FLAGYL) 500 MG tablet  2 times daily        12/02/19 2251    doxycycline (MONODOX) 100 MG capsule  2 times daily        12/02/19 2251    ondansetron (ZOFRAN ODT) 4 MG disintegrating tablet  Every 8 hours PRN  12/02/19 2251          Portions of this note were generated with dragon dictation software. Dictation errors may occur despite best attempts at proofreading.   Sharman Cheek, MD 12/02/19 Lamar Blinks    Sharman Cheek, MD 12/18/19 [phone]

## 2019-12-02 NOTE — Discharge Instructions (Signed)
Your vaginal swab test showed bacterial vaginosis, which is an inflammation caused by bacteria but not a sexually transmitted infection.  Your other test results today were all okay and your ultrasound was normal.  Take metronidazole and doxycycline as prescribed to treat infection and resolve your symptoms.

## 2019-12-03 ENCOUNTER — Telehealth: Payer: Self-pay

## 2019-12-03 LAB — CERVICOVAGINAL ANCILLARY ONLY
Chlamydia: NEGATIVE
Comment: NEGATIVE
Comment: NORMAL
Neisseria Gonorrhea: NEGATIVE

## 2019-12-03 NOTE — Progress Notes (Signed)
Pls let pt know STD testing neg. Pt went to ED over wknd. Is she still having pain? Thx

## 2019-12-03 NOTE — Telephone Encounter (Signed)
Pt aware of test results; states she is in a lot of pain; has flu like sxs, sore throat, H/As, chills, sounds congested, body aches, and pelvic pain; is 8 on pain scale; states she is very uncomfortable.  Adv I would let ABC know.

## 2019-12-04 NOTE — Telephone Encounter (Signed)
Pt went to ED for pelvic pain after retained tampon. Had neg GYN u/s, IUD in correct location. Treated empirically with rocephin, doxy and flagyl.  Rita, pls see if she has had covid test or is seeing a provider? If not, she can f/u with PCP or urgent care. Abx given at ED should treat any possible infection from tampon.

## 2019-12-05 NOTE — Telephone Encounter (Signed)
LMTC

## 2019-12-05 NOTE — Telephone Encounter (Signed)
Pt aware of what ABC adv'd.  States she was told to f/u c GYN.  Adv pt to finish antibx wait a week or two and come in to be re-examined.  Pt aware I will send this msg to front for appt to be made in around 3wks.  Pt voices understanding.

## 2019-12-06 NOTE — Telephone Encounter (Signed)
Patient is schedule for 12/27/19/ Left voicemail for patient to call back to confirm scheduled appointment

## 2019-12-27 ENCOUNTER — Ambulatory Visit: Payer: 59 | Admitting: Obstetrics and Gynecology

## 2020-10-25 IMAGING — CR DG ANKLE COMPLETE 3+V*L*
3 series · 3 of 3 positions shown · non-contrast
Comparison: None.

CLINICAL DATA: Left ankle pain and swelling after injury. Tripped
in ditch.

EXAM:
LEFT ANKLE COMPLETE - 3+ VIEW

[ankle ap]
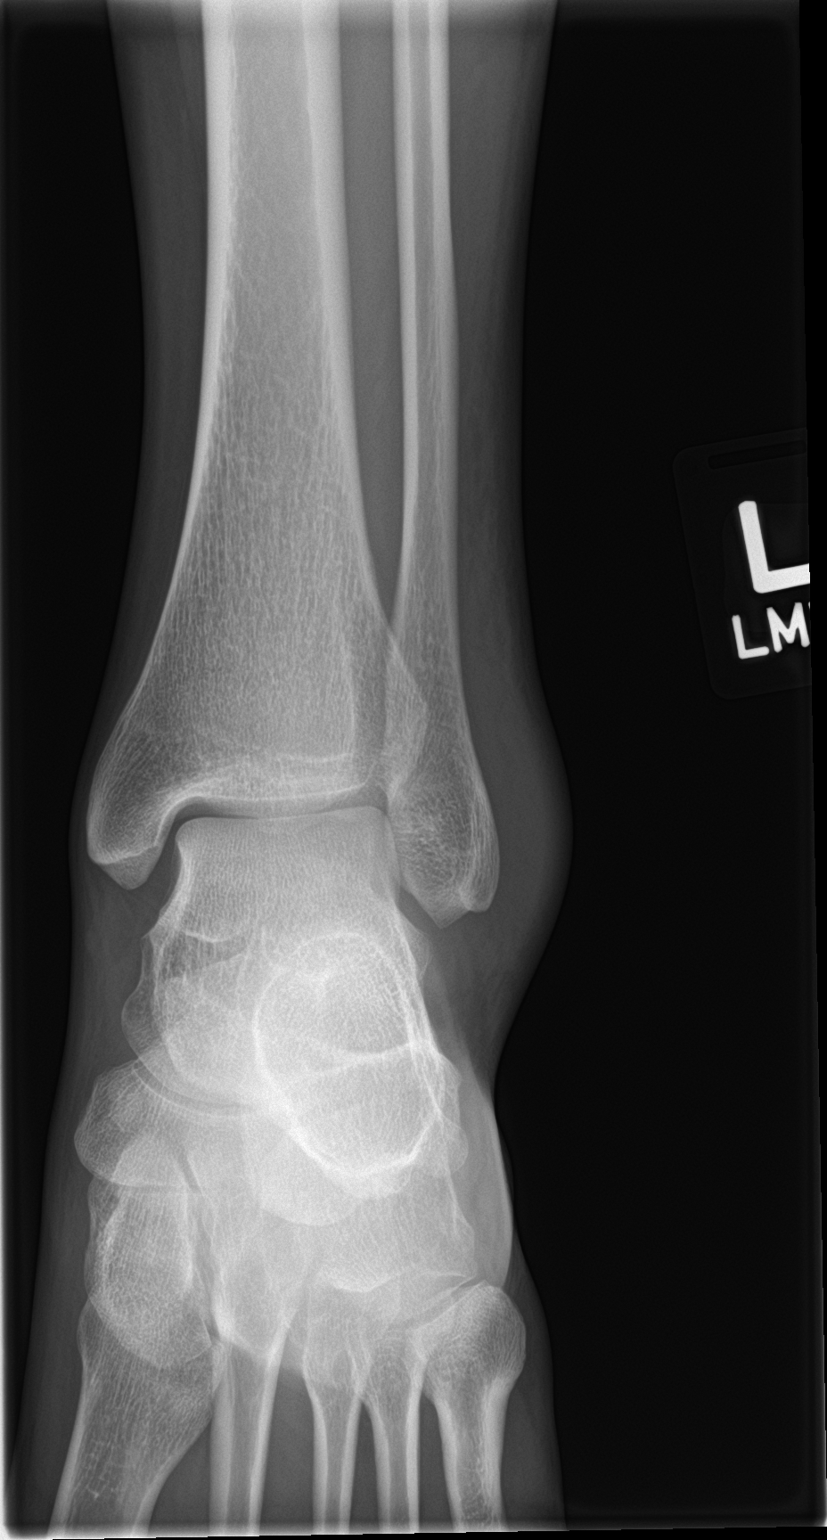

[ankle obl]
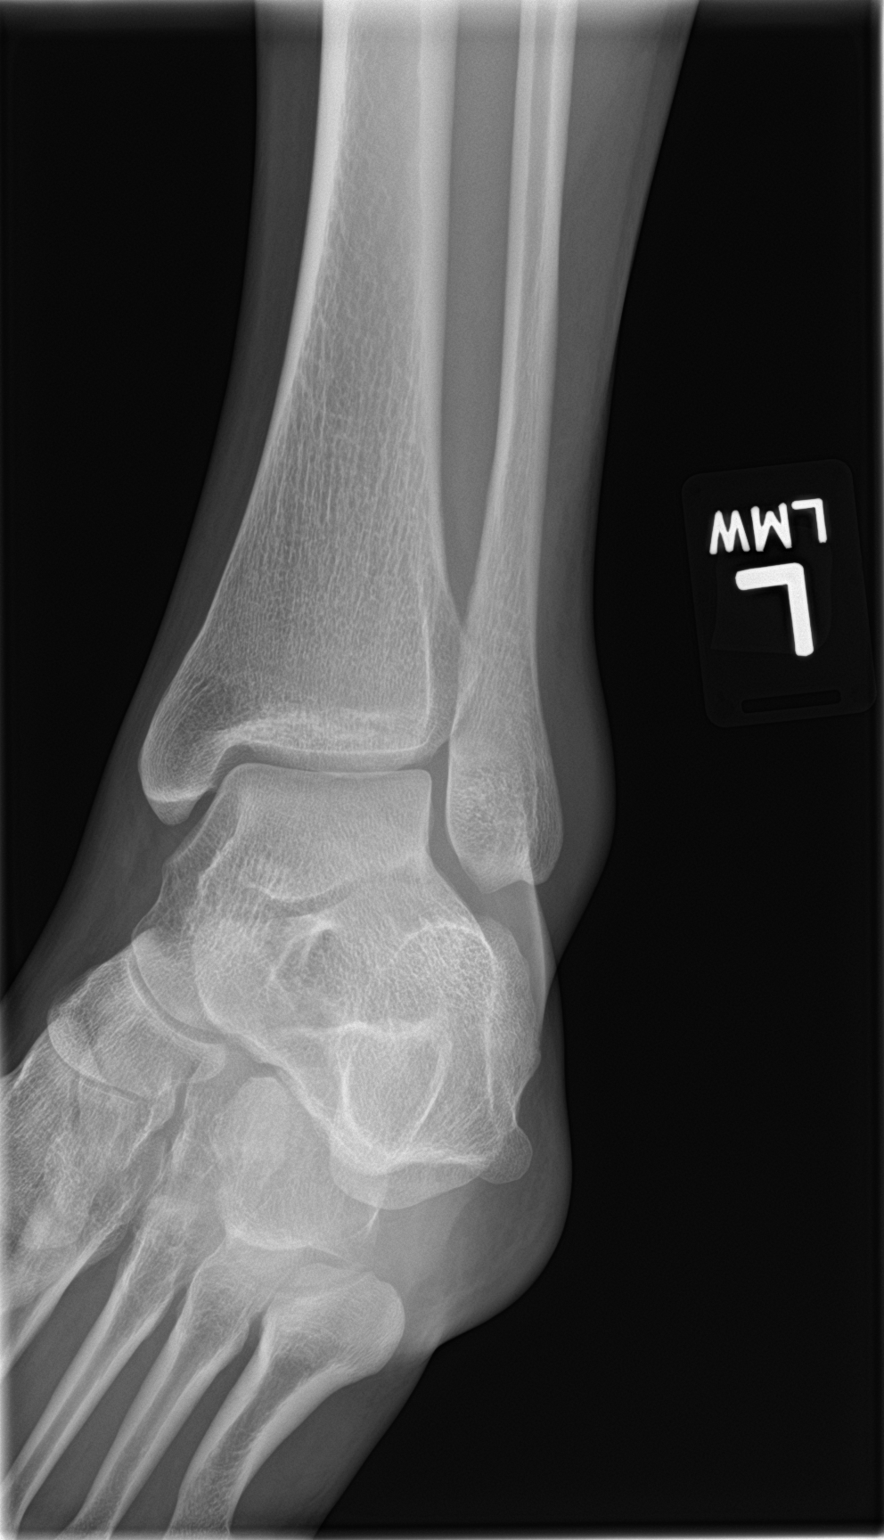

[ankle lat]
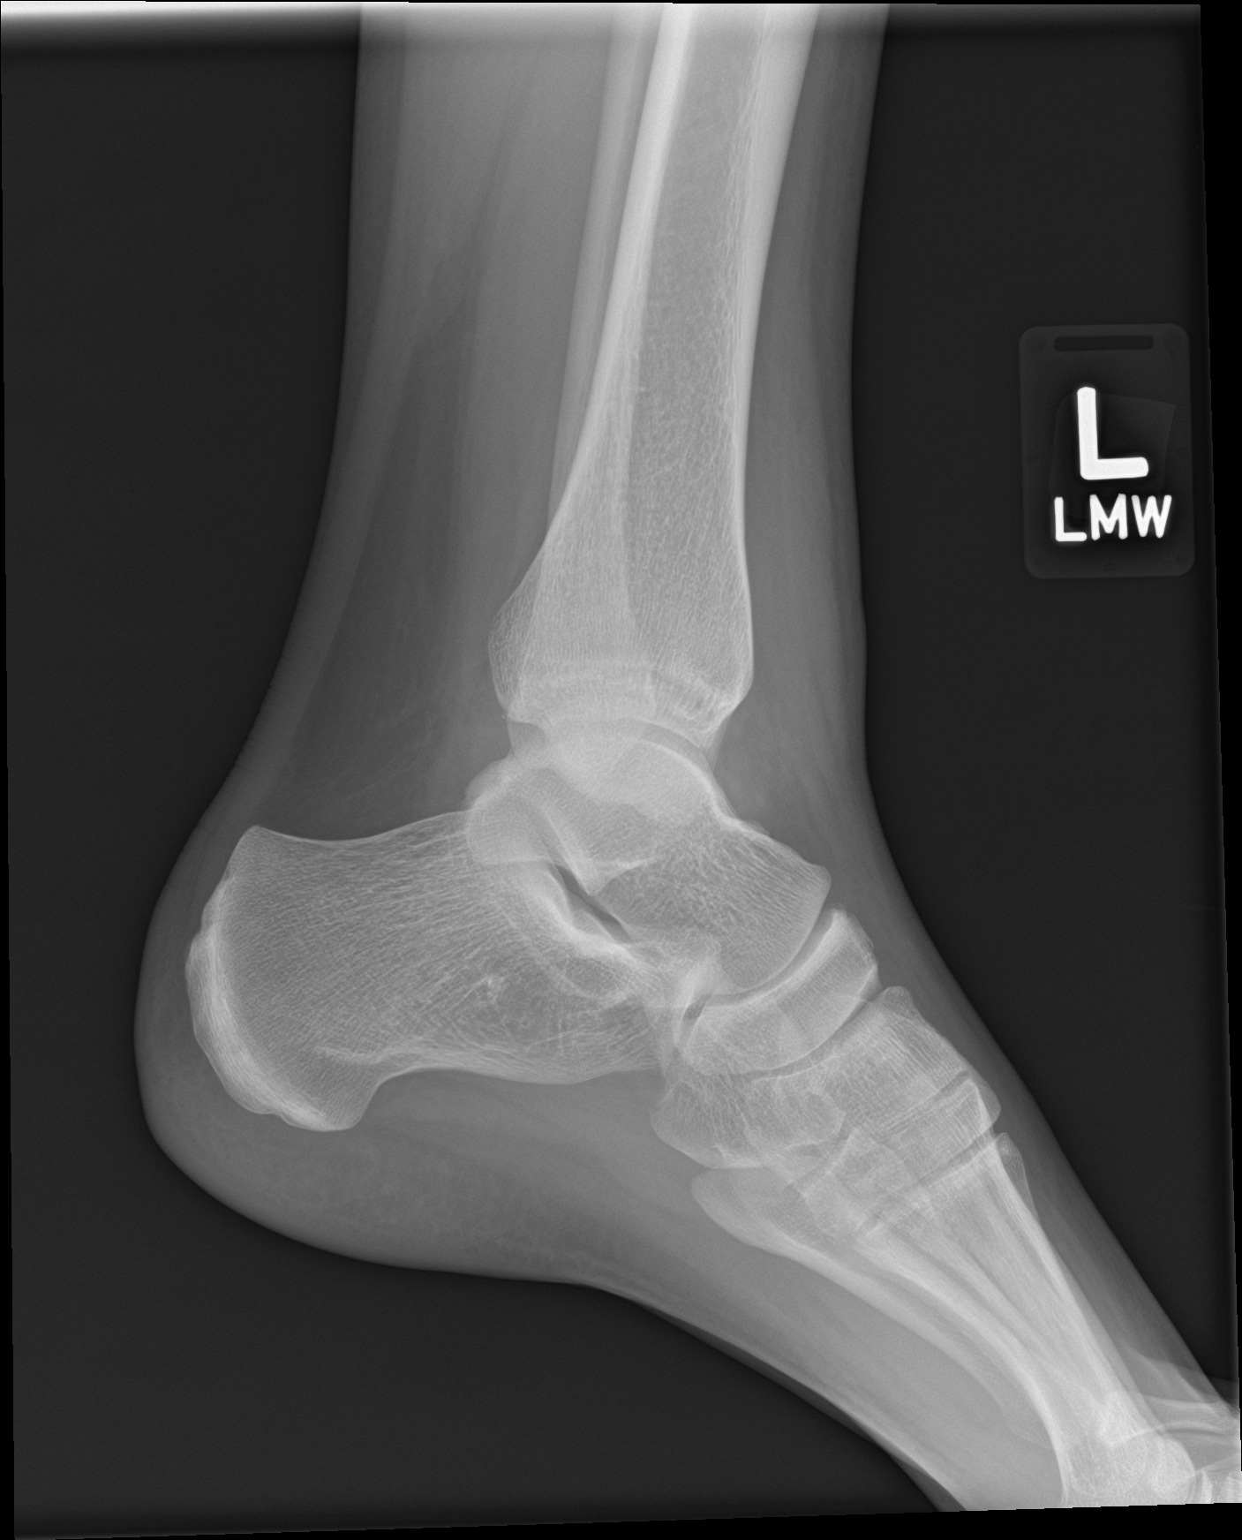

[3 of 3 positions shown; findings below may reference images not displayed]

FINDINGS: There is no evidence of fracture or dislocation. Small ankle joint
effusion. There is no evidence of arthropathy or other focal bone
abnormality. Soft tissue edema adjacent to the lateral malleolus and
anteriorly.
IMPRESSION: Soft tissue edema and small joint effusion. No fracture or
subluxation.

## 2020-11-15 IMAGING — MR MR ANKLE*L* W/O CM
5 series · 40 of 40 positions shown · non-contrast
Comparison: Left ankle x-rays dated November 01, 2018.

CLINICAL DATA: Lateral ankle pain and swelling since twisting
injury 3 weeks ago.

EXAM:
MRI OF THE LEFT ANKLE WITHOUT CONTRAST
TECHNIQUE: Multiplanar, multisequence MR imaging of the ankle was performed. No
intravenous contrast was administered.

[Series 4: PD fat-sat · axial · left · 3.0mm · 0.50mm/px · z∈[-82,+58]mm · 11 of 36 slices shown]
[im 1/36]
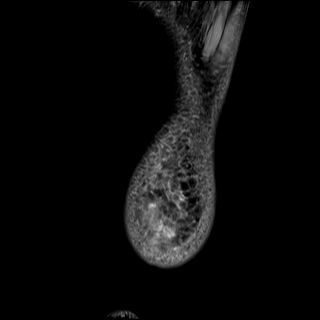
[im 4/36]
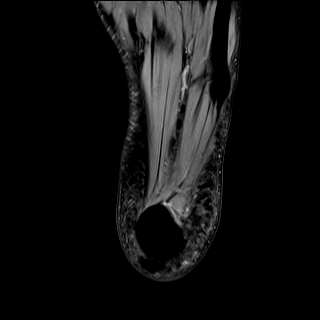
[im 8/36]
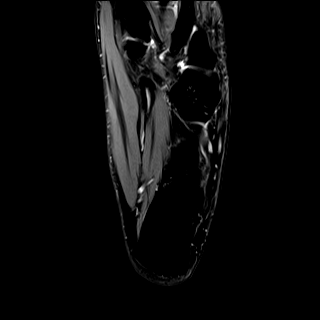
[im 11/36]
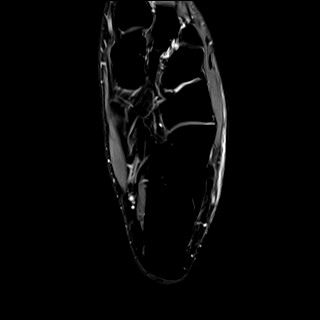
[im 15/36]
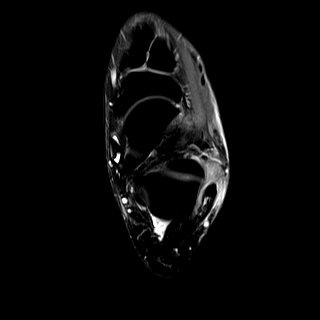
[im 18/36]
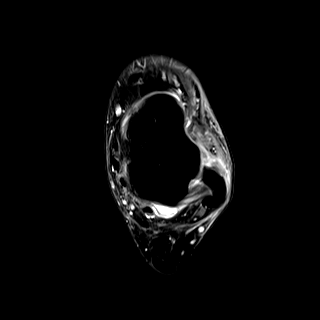
[im 22/36]
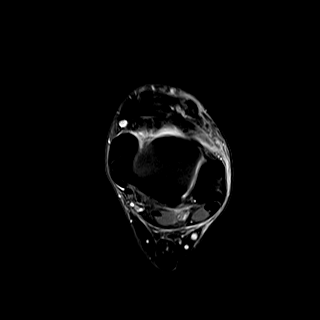
[im 25/36]
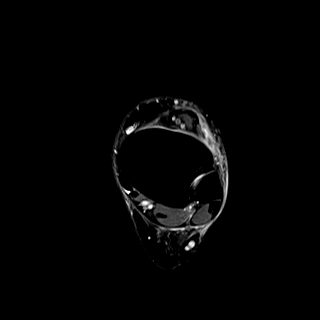
[im 29/36]
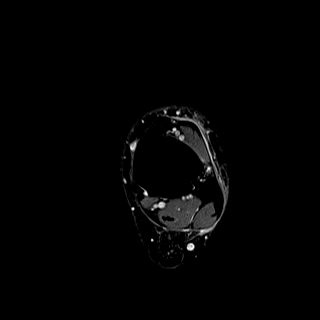
[im 32/36]
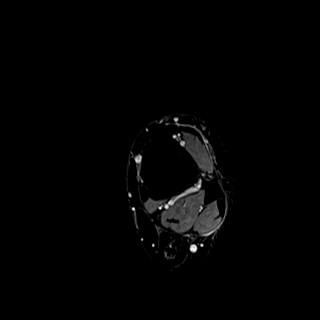
[im 36/36]
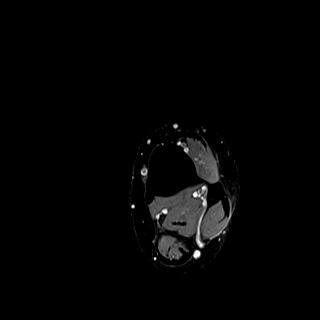

[Series 5: T2 fat-sat · axial · left · 3.0mm · 0.50mm/px · z∈[-82,+58]mm · 11 of 36 slices shown (1 of 2)]
[im 1/36]
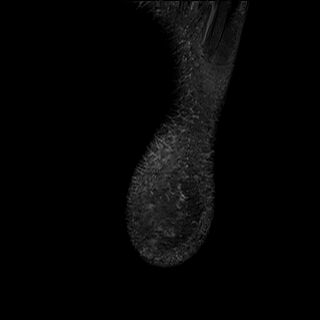
[im 4/36]
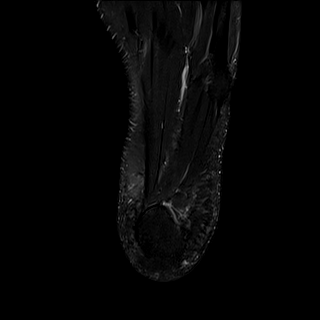
[im 8/36]
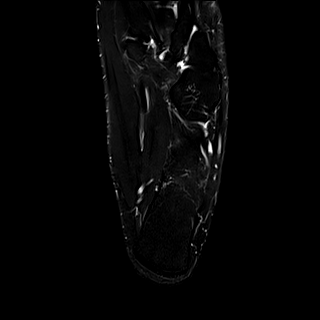
[im 11/36]
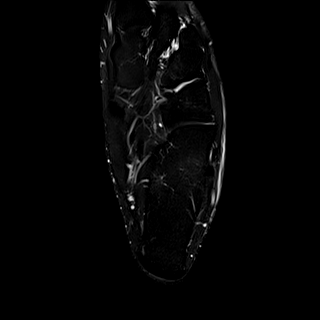
[im 15/36]
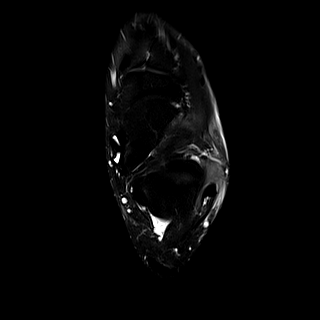
[im 18/36]
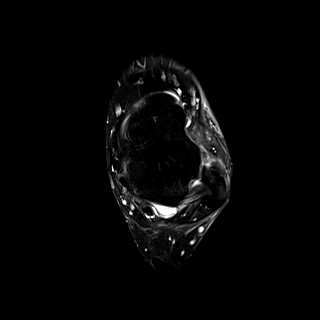
[im 22/36]
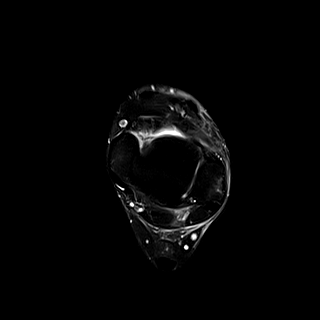
[im 25/36]
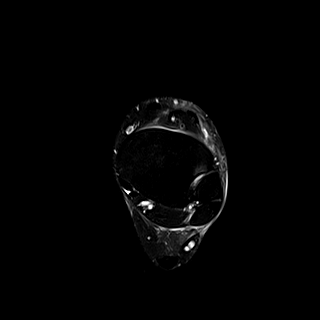
[im 29/36]
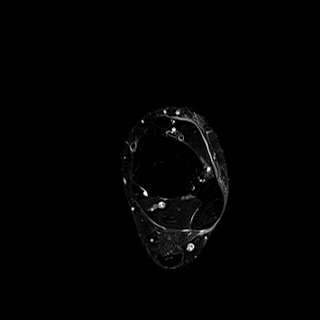
[im 32/36]
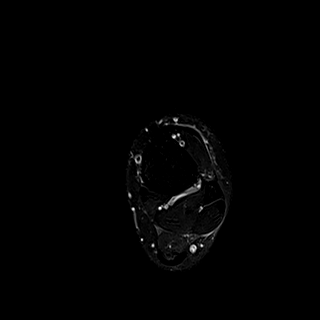
[im 36/36]
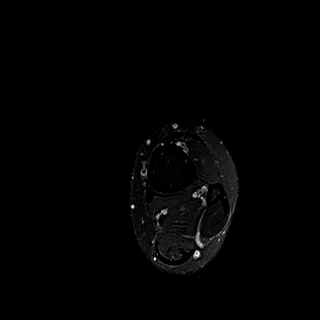

[Series 6: T2 fat-sat · coronal · left · 3.0mm · 0.62mm/px · 10 of 36 slices shown (2 of 2)]
[im 1/36]
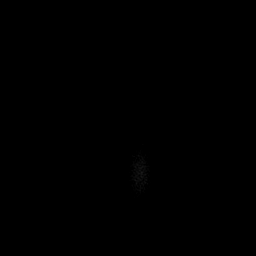
[im 4/36]
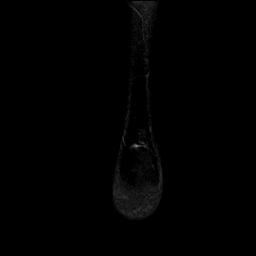
[im 8/36]
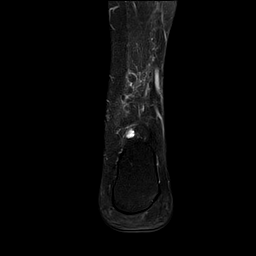
[im 12/36]
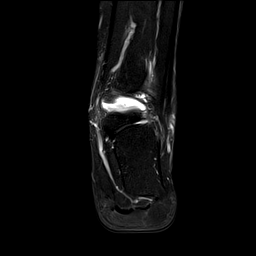
[im 16/36]
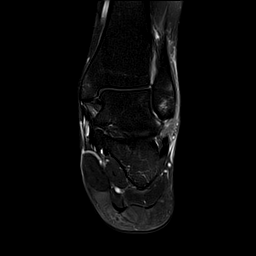
[im 20/36]
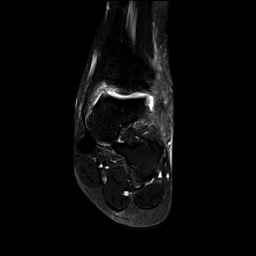
[im 24/36]
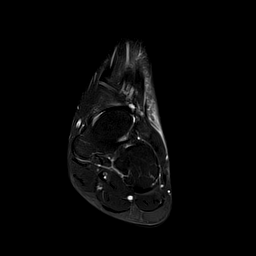
[im 28/36]
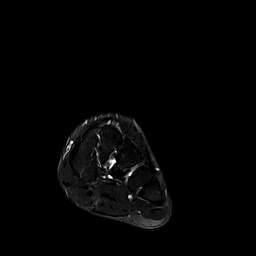
[im 32/36]
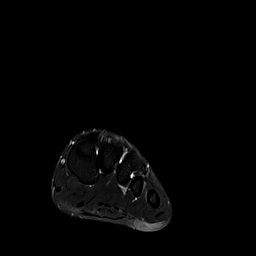
[im 36/36]
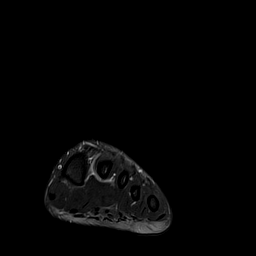

[Series 7: T1 · sagittal · left · 4.0mm · 0.70mm/px · 4 of 15 slices shown]
[im 1/15]
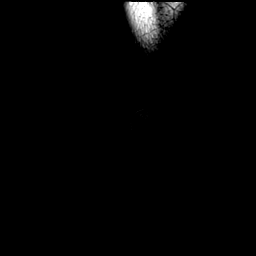
[im 5/15]
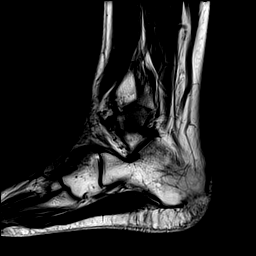
[im 10/15]
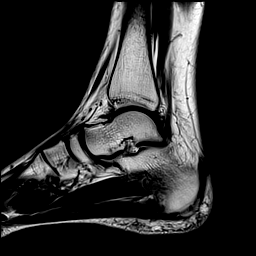
[im 15/15]
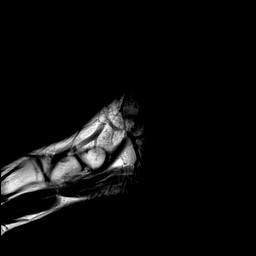

[Series 8: STIR · sagittal · left · 4.0mm · 0.35mm/px · 4 of 15 slices shown]
[im 1/15]
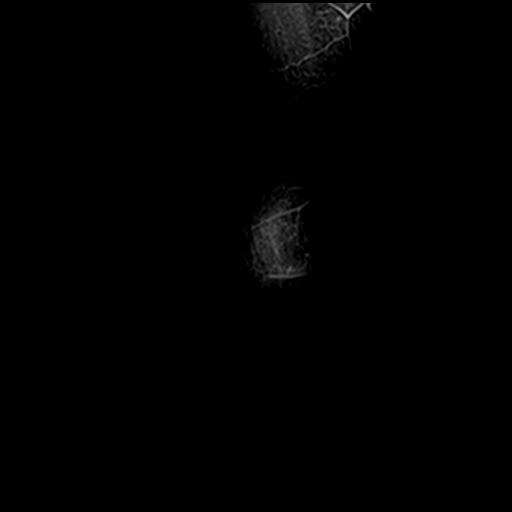
[im 5/15]
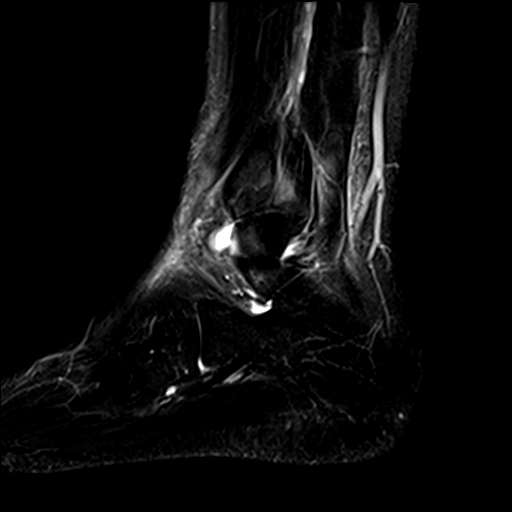
[im 10/15]
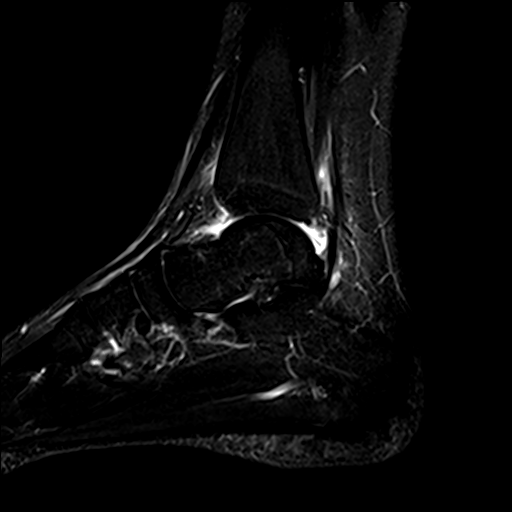
[im 15/15]
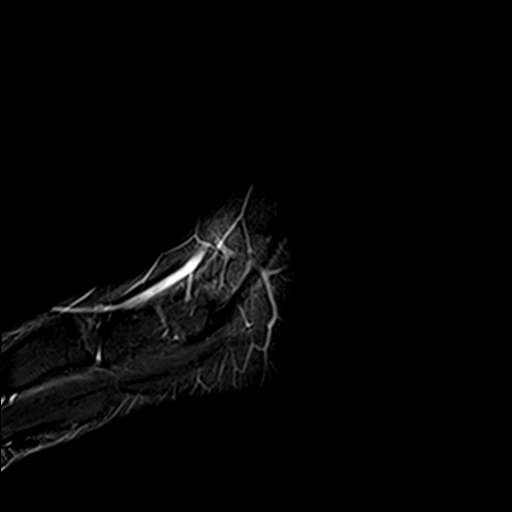

[40 of 40 positions shown; findings below may reference images not displayed]

FINDINGS: TENDONS

Peroneal: Peroneal longus tendon intact. Peroneal brevis intact.

Posteromedial: Posterior tibial tendon intact with mild
tenosynovitis. Flexor hallucis longus tendon intact. Flexor
digitorum longus tendon intact.

Anterior: Tibialis anterior tendon intact. Extensor hallucis longus
tendon intact Extensor digitorum longus tendon intact.

Achilles:  Intact.

Plantar Fascia: Intact.

LIGAMENTS

Lateral: Mild thickening and irregularity of the anterior
talofibular ligament, consistent with partial tear. Calcaneofibular
ligament intact. Posterior talofibular ligament intact. Anterior and
posterior tibiofibular ligaments intact.

Medial: Deltoid ligament intact. Spring ligament intact.

CARTILAGE

Ankle Joint: Small joint effusion. Normal ankle mortise. No chondral
defect.

Subtalar Joints/Sinus Tarsi: Normal subtalar joints. No subtalar
joint effusion. Normal sinus tarsi.

Bones: Patchy edema in distal fibula with suspected tiny avulsion
fracture at the anterior tip of the lateral malleolus. Focal edema
in the posterolateral talar dome. No dislocation.

Soft Tissue: Mild lateral hindfoot soft tissue swelling. No soft
tissue mass or fluid collection.
IMPRESSION: 1. Suspected tiny avulsion fracture of the anterior tip of the
lateral malleolus.
2. Partial tear of the anterior talofibular ligament.
3. Contusion in the posterolateral talar dome.
4. Mild posterior tibial tenosynovitis.

## 2021-11-25 IMAGING — US US PELVIS COMPLETE WITH TRANSVAGINAL
1 series · 14 of 25 positions shown · non-contrast
Comparison: None

CLINICAL DATA: Pelvic pain and discharge



[Series 1: us pelvis (transabdominal only) · 81 acquisitions, 14 frames shown]
[im 1/81]
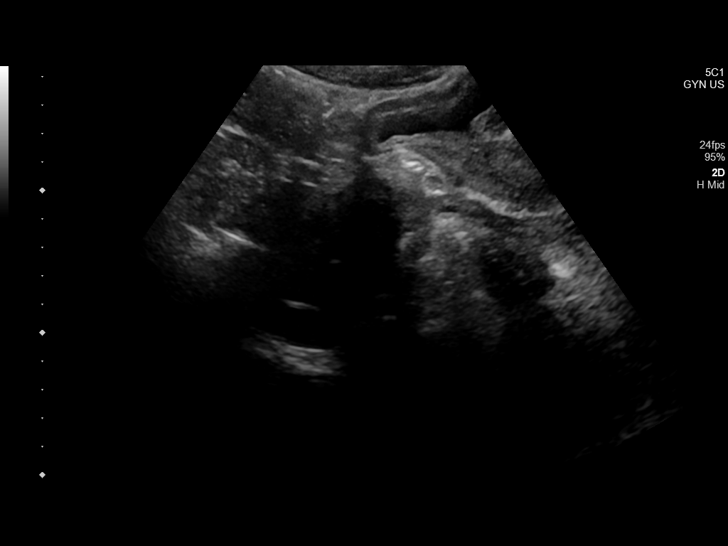
[im 7/81]
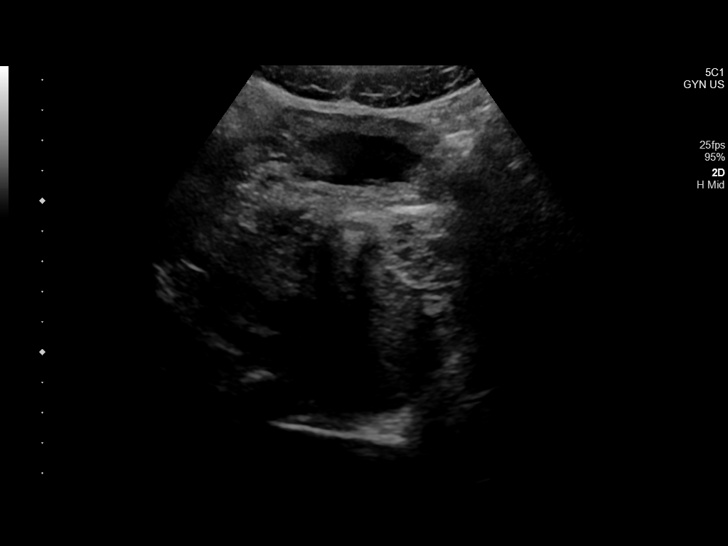
[im 14/81]
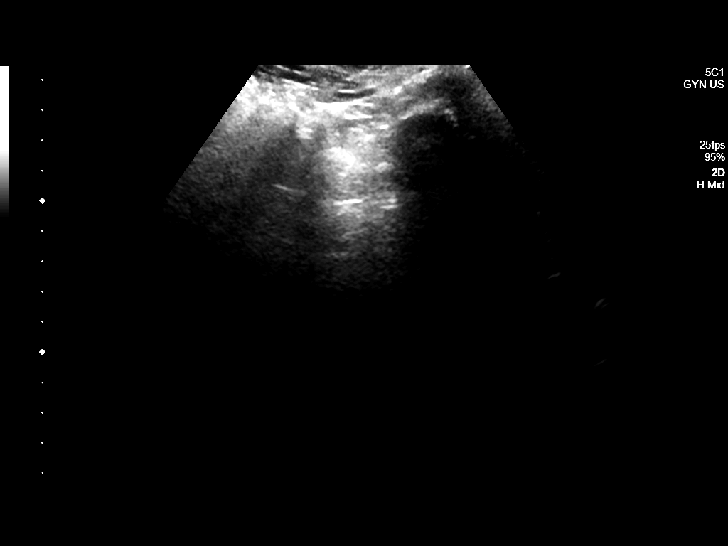
[im 21/81]
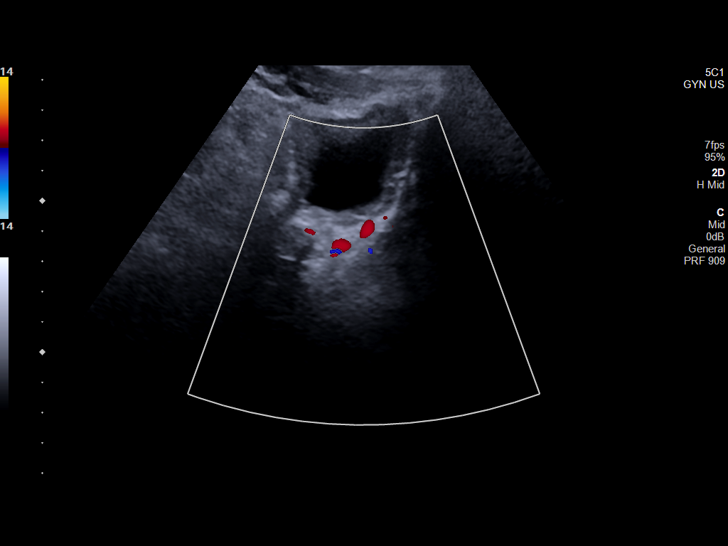
[im 27/81]
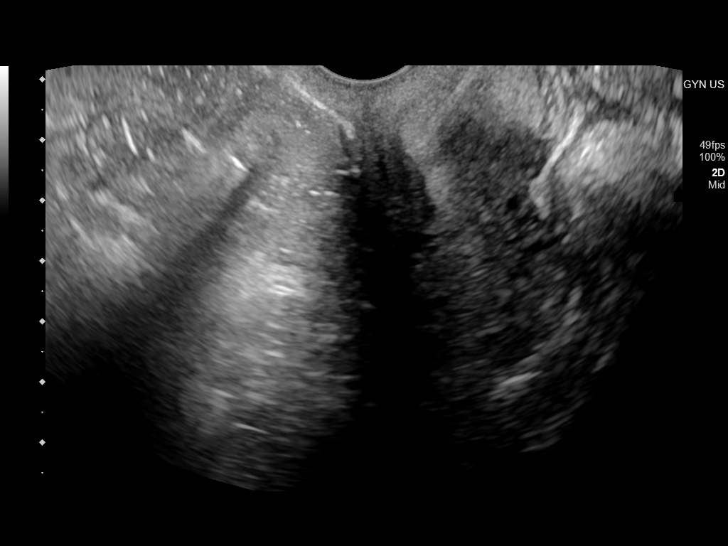
[im 31/81]
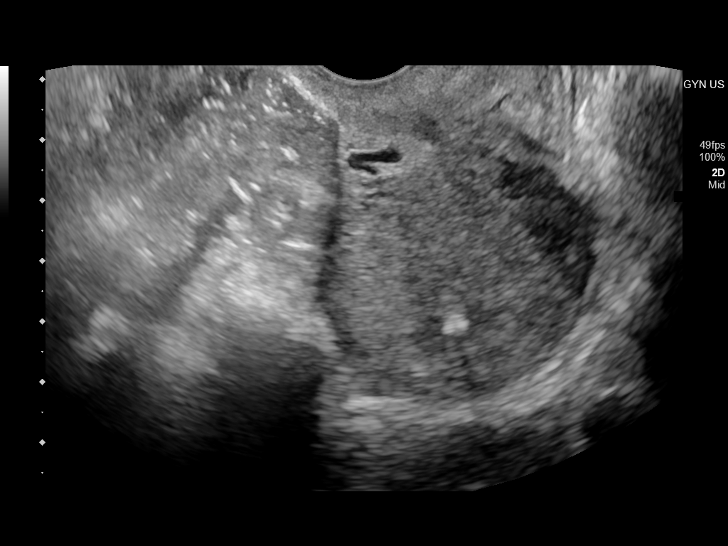
[im 37/81]
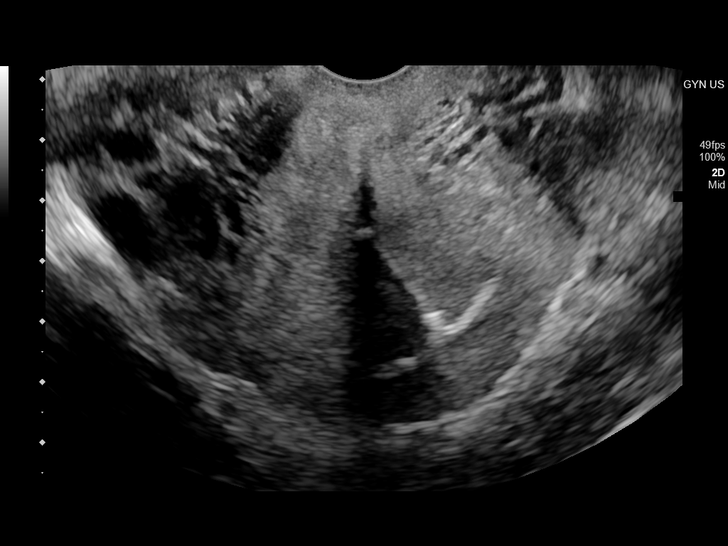
[im 44/81]
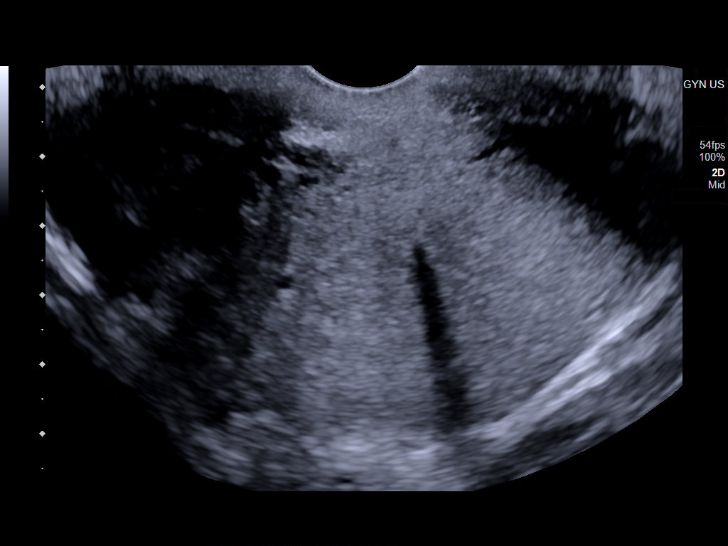
[im 51/81]
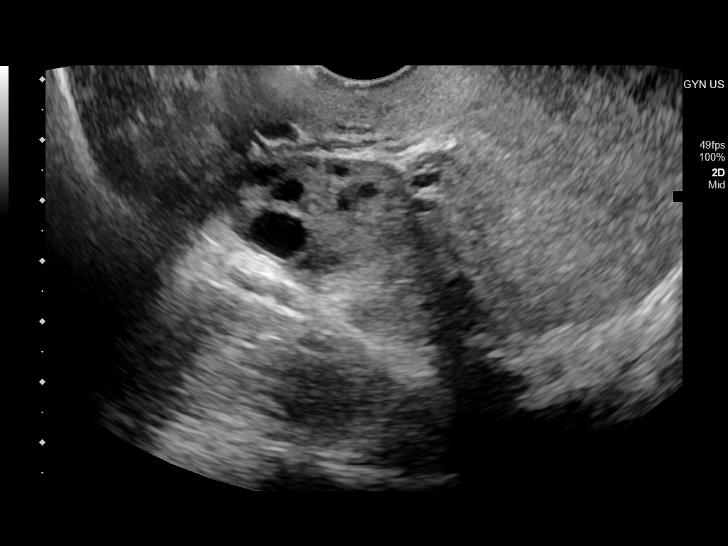
[im 54/81]
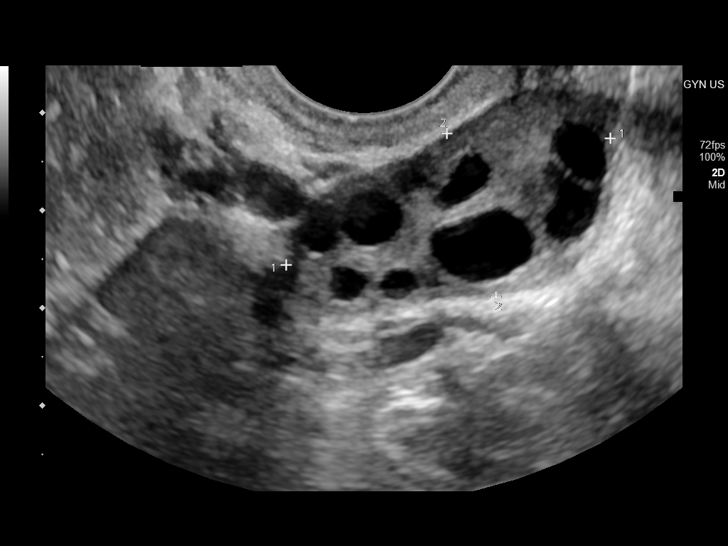
[im 61/81]
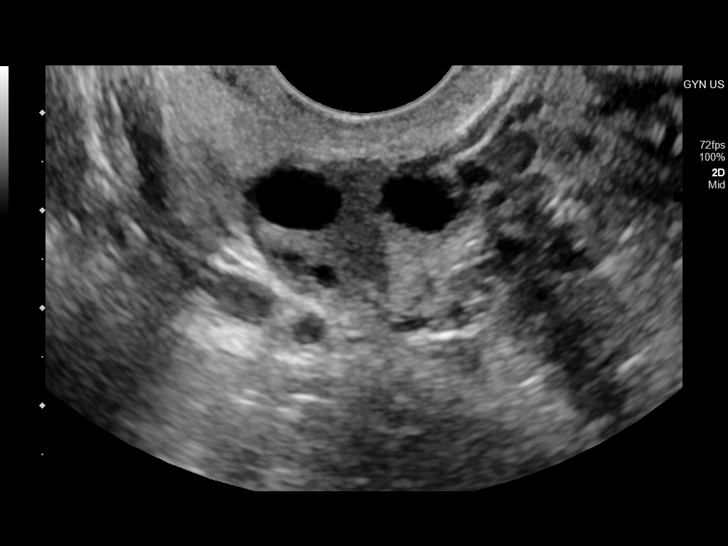
[im 67/81]
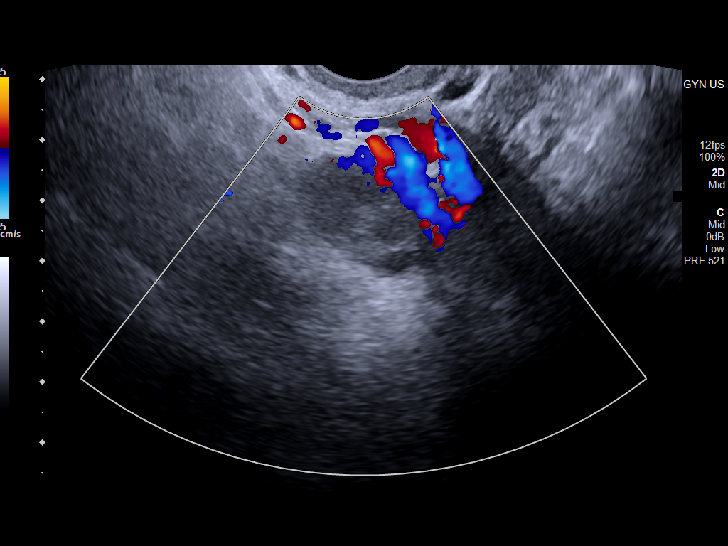
[im 74/81]
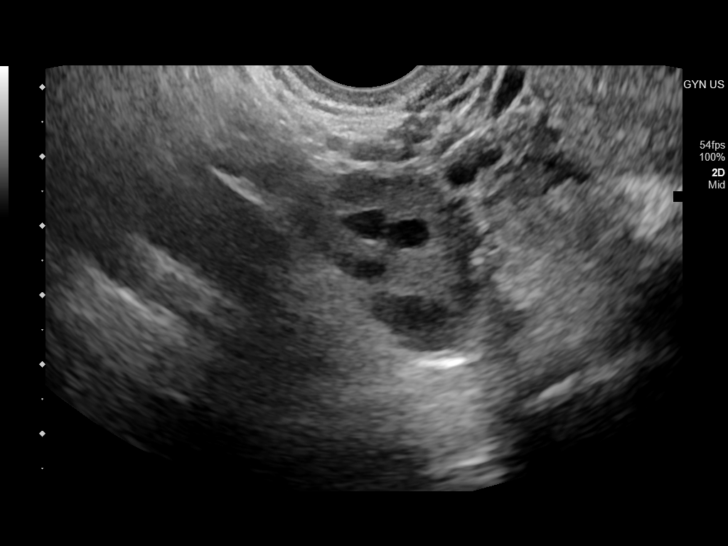
[im 81/81]
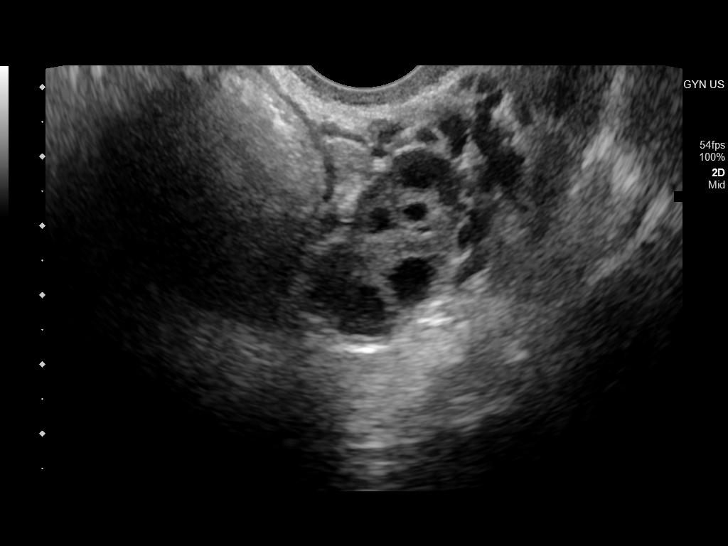

[14 of 25 positions shown; findings below may reference images not displayed]

FINDINGS: Uterus

Measurements: 6.9 x 4.6 x 5.4 cm = volume: 8.9 mL. No fibroids or
other mass visualized.

Endometrium

Thickness: 1.8 mm no focal abnormality visualized. IUD seen within
the endometrial canal.

Right ovary

Measurements: 3.6 x 1.8 x 2.8 cm = volume: 9 mL. Normal
appearance/no adnexal mass.

Left ovary

Measurements: 3.3 x 2.2 x 2.2 cm = volume: 9 mL. Normal
appearance/no adnexal mass.

Other findings

No abnormal free fluid.
IMPRESSION: IUD within the endometrial canal.

Normal appearing ovaries
